# Patient Record
Sex: Female | Born: 1940 | Race: White | Hispanic: No | Smoking: Former smoker
Health system: Southern US, Community
[De-identification: ages and names within clinical notes are randomized; demographics above are authoritative.]

## PROBLEM LIST (undated history)

## (undated) DIAGNOSIS — H109 Unspecified conjunctivitis: Secondary | ICD-10-CM

## (undated) DIAGNOSIS — M1990 Unspecified osteoarthritis, unspecified site: Secondary | ICD-10-CM

## (undated) DIAGNOSIS — F329 Major depressive disorder, single episode, unspecified: Secondary | ICD-10-CM

## (undated) DIAGNOSIS — R51 Headache: Secondary | ICD-10-CM

## (undated) DIAGNOSIS — F32A Depression, unspecified: Secondary | ICD-10-CM

## (undated) DIAGNOSIS — G4730 Sleep apnea, unspecified: Secondary | ICD-10-CM

## (undated) DIAGNOSIS — E7800 Pure hypercholesterolemia, unspecified: Secondary | ICD-10-CM

## (undated) DIAGNOSIS — G20 Parkinson's disease: Secondary | ICD-10-CM

## (undated) DIAGNOSIS — F0280 Dementia in other diseases classified elsewhere without behavioral disturbance: Secondary | ICD-10-CM

## (undated) DIAGNOSIS — I10 Essential (primary) hypertension: Secondary | ICD-10-CM

## (undated) DIAGNOSIS — K219 Gastro-esophageal reflux disease without esophagitis: Secondary | ICD-10-CM

## (undated) DIAGNOSIS — R413 Other amnesia: Secondary | ICD-10-CM

## (undated) DIAGNOSIS — M797 Fibromyalgia: Secondary | ICD-10-CM

## (undated) HISTORY — DX: Other amnesia: R41.3

## (undated) HISTORY — PX: APPENDECTOMY: SHX54

## (undated) HISTORY — PX: TONSILLECTOMY: SUR1361

## (undated) HISTORY — DX: Parkinson's disease: G20

## (undated) HISTORY — DX: Pure hypercholesterolemia, unspecified: E78.00

## (undated) HISTORY — PX: CARPAL TUNNEL RELEASE: SHX101

---
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
## 2013-09-22 ENCOUNTER — Other Ambulatory Visit: Payer: Self-pay | Admitting: Orthopedic Surgery

## 2013-09-29 NOTE — Patient Instructions (Addendum)
20     Your procedure is scheduled on:  Wednesday 10/11/2013  Report to [facility] Main Entrance and follow signs to Short Stay  At 0730  AM.  Call this number if you have problems the night before or morning of surgery:  [phone]   Remember:             IF YOU USE CPAP,BRING MASK AND TUBING AM OF SURGERY!             IF YOU DO NOT HAVE YOUR TYPE AND SCREEN DRAWN AT PRE-ADMIT APPOINTMENT, YOU WILL HAVE IT DRAWN AM OF SURGERY!   Do not eat food or drink liquids AFTER MIDNIGHT!  Take these medicines the morning of surgery with A SIP OF WATER: Metoprolol, Gabapentin,  Fetzima, Fiorcet, Tramadol if needed    [facility] IS NOT RESPONSIBLE FOR ANY BELONGINGS OR VALUABLES BROUGHT TO HOSPITAL.  Marland Kitchen.  Leave suitcase in the car. After surgery it may be brought to your room.  For patients admitted to the hospital, checkout time is 11:00 AM the day of              Discharge.    DO NOT WEAR  JEWELRY,MAKE-UP,LOTIONS,POWDERS,PERFUMES,CONTACTS , DENTURES OR BRIDGEWORK ,AND DO NOT WEAR FALSE EYELASHES                                    Patients discharged the day of surgery will not be allowed to drive home.  If going home the same day of surgery, must have someone stay with you  first 24 hrs.at home and arrange for someone to drive you home from the              Hospital.              YOUR DRIVER IS: N/   Special Instructions:              Please read over the following fact sheets that you were given:             1. [facility] PREPARING FOR SURGERY SHEET              2.[facility] SP[facility]OMETRY                                        [place] M.Cathlean Saueranney,RN,BSN     [phone]                              [facility] - Preparing for Surgery Before surgery, you can play an important role.  Because skin is not sterile, your skin needs to be as free of germs as possible.  You can reduce the number of germs on your skin by washing with CHG (chlorahexidine gluconate) soap before surgery.  CHG is an  antiseptic cleaner which kills germs and bonds with the skin to continue killing germs even after washing. Please DO NOT use if you have an allergy to CHG or antibacterial soaps.  If your skin becomes reddened/irritated stop using the CHG and inform your nurse when you arrive at Short Stay. Do not shave (including legs and underarms) for at least 48 hours prior to the first CHG shower.  You may shave your face/neck. Please follow these instructions  carefully:  1.  Shower with CHG Soap the night before surgery and the  morning of Surgery.  2.  If you choose to wash your hair, wash your hair first as usual with your  normal  shampoo.  3.  After you shampoo, rinse your hair and body thoroughly to remove the  shampoo.                           4.  Use CHG as you would any other liquid soap.  You can apply chg directly  to the skin and wash                       Gently with a scrungie or clean washcloth.  5.  Apply the CHG Soap to your body ONLY FROM THE NECK DOWN.   Do not use on face/ open                           Wound or open sores. Avoid contact with eyes, ears mouth and genitals (private parts).                       Wash face,  Genitals (private parts) with your normal soap.             6.  Wash thoroughly, paying special attention to the area where your surgery  will be performed.  7.  Thoroughly rinse your body with warm water from the neck down.  8.  DO NOT shower/wash with your normal soap after using and rinsing off  the CHG Soap.                9.  Pat yourself dry with a clean towel.            10.  Wear clean pajamas.            11.  Place clean sheets on your bed the night of your first shower and do not  sleep with pets. Day of Surgery : Do not apply any lotions/deodorants the morning of surgery.  Please wear clean clothes to the hospital/surgery center.  FAILURE TO FOLLOW THESE INSTRUCTIONS MAY RESULT IN THE CANCELLATION OF YOUR SURGERY PATIENT  SIGNATURE_________________________________  NURSE SIGNATURE__________________________________  ________________________________________________________________________   Amber [name]  An incentive spirometer is a tool that can help keep your lungs clear and active. This tool measures how well you are filling your lungs with each breath. Taking long deep breaths may help reverse or decrease the chance of developing breathing (pulmonary) problems (especially infection) following:  A long period of time when you are unable to move or be active. BEFORE THE PROCEDURE   If the spirometer includes an indicator to show your best effort, your nurse or respiratory therapist will set it to a desired goal.  If possible, sit up straight or lean slightly forward. Try not to slouch.  Hold the incentive spirometer in an upright position. INSTRUCTIONS FOR USE  1. Sit on the edge of your bed if possible, or sit up as far as you can in bed or on a chair. 2. Hold the incentive spirometer in an upright position. 3. Breathe out normally. 4. Place the mouthpiece in your mouth and seal your lips tightly around it. 5. Breathe in slowly and as deeply as possible, raising the piston or the ball toward the top of  the column. 6. Hold your breath for 3-5 seconds or for as long as possible. Allow the piston or ball to fall to the bottom of the column. 7. Remove the mouthpiece from your mouth and breathe out normally. 8. Rest for a few seconds and repeat Steps 1 through 7 at least 10 times every 1-2 hours when you are awake. Take your time and take a few normal breaths between deep breaths. 9. The spirometer may include an indicator to show your best effort. Use the indicator as a goal to work toward during each repetition. 10. After each set of 10 deep breaths, practice coughing to be sure your lungs are clear. If you have an incision (the cut made at the time of surgery), support your incision when coughing  by placing a pillow or rolled up towels firmly against it. Once you are able to get out of bed, walk around indoors and cough well. You may stop using the incentive spirometer when instructed by your caregiver.  RISKS AND COMPLICATIONS  Take your time so you do not get dizzy or light-headed.  If you are in pain, you may need to take or ask for pain medication before doing incentive spirometry. It is harder to take a deep breath if you are having pain. AFTER USE  Rest and breathe slowly and easily.  It can be helpful to keep track of a log of your progress. Your caregiver can provide you with a simple table to help with this. If you are using the spirometer at home, follow these instructions: SEEK MEDICAL CARE IF:   You are having difficultly using the spirometer.  You have trouble using the spirometer as often as instructed.  Your pain medication is not giving enough relief while using the spirometer.  You develop fever of 100.5 F (38.1 C) or higher. SEEK IMMEDIATE MEDICAL CARE IF:   You cough up bloody sputum that had not been present before.  You develop fever of 102 F (38.9 C) or greater.  You develop worsening pain at or near the incision site. MAKE SURE YOU:   Understand these instructions.  Will watch your condition.  Will get help right away if you are not doing well or get worse. Document Released: 08/17/2006 Document Revised: 06/29/2011 Document Reviewed: 10/18/2006 ExitCare Patient Information 2014 ExitCare, [place].   ________________________________________________________________________  WHAT IS A BLOOD TRANSFUSION? Blood Transfusion Information  A transfusion is the replacement of blood or some of its parts. Blood is made up of multiple cells which provide different functions.  Red blood cells carry oxygen and are used for blood loss replacement.  White blood cells fight against infection.  Platelets control bleeding.  Plasma helps clot  blood.  Other blood products are available for specialized needs, such as hemophilia or other clotting disorders. BEFORE THE TRANSFUSION  Who gives blood for transfusions?   Healthy volunteers who are fully evaluated to make sure their blood is safe. This is blood bank blood. Transfusion therapy is the safest it has ever been in the practice of medicine. Before blood is taken from a donor, a complete history is taken to make sure that person has no history of diseases nor engages in risky social behavior (examples are intravenous drug use or sexual activity with multiple partners). The donor's travel history is screened to minimize risk of transmitting infections, such as malaria. The donated blood is tested for signs of infectious diseases, such as HIV and hepatitis. The blood is then tested to be  sure it is compatible with you in order to minimize the chance of a transfusion reaction. If you or a relative donates blood, this is often done in anticipation of surgery and is not appropriate for emergency situations. It takes many days to process the donated blood. RISKS AND COMPLICATIONS Although transfusion therapy is very safe and saves many lives, the main dangers of transfusion include:   Getting an infectious disease.  Developing a transfusion reaction. This is an allergic reaction to something in the blood you were given. Every precaution is taken to prevent this. The decision to have a blood transfusion has been considered carefully by your caregiver before blood is given. Blood is not given unless the benefits outweigh the risks. AFTER THE TRANSFUSION  Right after receiving a blood transfusion, you will usually feel much better and more energetic. This is especially true if your red blood cells have gotten low (anemic). The transfusion raises the level of the red blood cells which carry oxygen, and this usually causes an energy increase.  The nurse administering the transfusion will monitor  you carefully for complications. HOME CARE INSTRUCTIONS  No special instructions are needed after a transfusion. You may find your energy is better. Speak with your caregiver about any limitations on activity for underlying diseases you may have. SEEK MEDICAL CARE IF:   Your condition is not improving after your transfusion.  You develop redness or irritation at the intravenous (IV) site. SEEK IMMEDIATE MEDICAL CARE IF:  Any of the following symptoms occur over the next 12 hours:  Shaking chills.  You have a temperature by mouth above 102 F (38.9 C), not controlled by medicine.  Chest, back, or muscle pain.  People around you feel you are not acting correctly or are confused.  Shortness of breath or difficulty breathing.  Dizziness and fainting.  You get a rash or develop hives.  You have a decrease in urine output.  Your urine turns a dark color or changes to pink, red, or brown. Any of the following symptoms occur over the next 10 days:  You have a temperature by mouth above 102 F (38.9 C), not controlled by medicine.  Shortness of breath.  Weakness after normal activity.  The white part of the eye turns yellow (jaundice).  You have a decrease in the amount of urine or are urinating less often.  Your urine turns a dark color or changes to pink, red, or brown. Document Released: 04/03/2000 Document Revised: 06/29/2011 Document Reviewed: 11/21/2007 [facility] Patient Information 2014 [place], [place].  _______________________________________________________________________

## 2013-10-02 ENCOUNTER — Encounter (HOSPITAL_COMMUNITY): Payer: Self-pay | Admitting: Pharmacy Technician

## 2013-10-03 ENCOUNTER — Encounter (HOSPITAL_COMMUNITY)
Admission: RE | Admit: 2013-10-03 | Discharge: 2013-10-03 | Disposition: A | Discharge status: Home / Self Care | Payer: Medicare Other | Source: Ambulatory Visit | Attending: Orthopedic Surgery | Admitting: Orthopedic Surgery

## 2013-10-03 ENCOUNTER — Encounter (INDEPENDENT_AMBULATORY_CARE_PROVIDER_SITE_OTHER): Payer: Self-pay

## 2013-10-03 ENCOUNTER — Ambulatory Visit (HOSPITAL_COMMUNITY)
Admission: RE | Admit: 2013-10-03 | Discharge: 2013-10-03 | Disposition: A | Discharge status: Home / Self Care | Payer: Medicare Other | Source: Ambulatory Visit | Attending: Orthopedic Surgery | Admitting: Orthopedic Surgery

## 2013-10-03 ENCOUNTER — Encounter (HOSPITAL_COMMUNITY): Payer: Self-pay

## 2013-10-03 DIAGNOSIS — M4120 Other idiopathic scoliosis, site unspecified: Secondary | ICD-10-CM | POA: Insufficient documentation

## 2013-10-03 DIAGNOSIS — M25559 Pain in unspecified hip: Secondary | ICD-10-CM | POA: Insufficient documentation

## 2013-10-03 HISTORY — DX: Fibromyalgia: M79.7

## 2013-10-03 HISTORY — DX: Major depressive disorder, single episode, unspecified: F32.9

## 2013-10-03 HISTORY — DX: Sleep apnea, unspecified: G47.30

## 2013-10-03 HISTORY — DX: Gastro-esophageal reflux disease without esophagitis: K21.9

## 2013-10-03 HISTORY — DX: Depression, unspecified: F32.A

## 2013-10-03 HISTORY — DX: Essential (primary) hypertension: I10

## 2013-10-03 HISTORY — DX: Headache: R51

## 2013-10-03 HISTORY — DX: Unspecified osteoarthritis, unspecified site: M19.90

## 2013-10-03 LAB — ABO/RH: ABO/RH(D): B POS

## 2013-10-03 LAB — COMPREHENSIVE METABOLIC PANEL
ALBUMIN: 3.8 g/dL (ref 3.5–5.2)
ALT: 17 U/L (ref 0–35)
AST: 23 U/L (ref 0–37)
Alkaline Phosphatase: 80 U/L (ref 39–117)
BUN: 19 mg/dL (ref 6–23)
CO2: 27 mEq/L (ref 19–32)
Calcium: 9.4 mg/dL (ref 8.4–10.5)
Chloride: 103 mEq/L (ref 96–112)
Creatinine, Ser: 0.69 mg/dL (ref 0.50–1.10)
GFR calc Af Amer: >90 mL/min (ref >90)
GFR calc non Af Amer: 84 mL/min — ABNORMAL LOW (ref >90)
Glucose, Bld: 93 mg/dL (ref 70–99)
Potassium: 4.8 mEq/L (ref 3.7–5.3)
Sodium: 141 mEq/L (ref 137–147)
Total Protein: 6.6 g/dL (ref 6.0–8.3)

## 2013-10-03 LAB — URINALYSIS, ROUTINE W REFLEX MICROSCOPIC
BILIRUBIN URINE: NEGATIVE
GLUCOSE, UA: NEGATIVE mg/dL
Hgb urine dipstick: NEGATIVE
KETONES UR: NEGATIVE mg/dL
LEUKOCYTES UA: NEGATIVE
Nitrite: NEGATIVE
Protein, ur: NEGATIVE mg/dL
SPECIFIC GRAVITY, URINE: 1.019 (ref 1.005–1.030)
Urobilinogen, UA: 0.2 mg/dL (ref 0.0–1.0)
pH: 6 (ref 5.0–8.0)

## 2013-10-03 LAB — SURGICAL PCR SCREEN
MRSA, PCR: NEGATIVE
STAPHYLOCOCCUS AUREUS: POSITIVE — AB

## 2013-10-03 LAB — CBC
HCT: 38.9 % (ref 36.0–46.0)
Hemoglobin: 12.7 g/dL (ref 12.0–15.0)
MCH: 32 pg (ref 26.0–34.0)
MCHC: 32.6 g/dL (ref 30.0–36.0)
MCV: 98 fL (ref 78.0–100.0)
PLATELETS: 269 10*3/uL (ref 150–400)
RBC: 3.97 MIL/uL (ref 3.87–5.11)
RDW: 13.1 % (ref 11.5–15.5)
WBC: 5.6 10*3/uL (ref 4.0–10.5)

## 2013-10-03 LAB — PROTIME-INR
INR: 0.94 (ref 0.00–1.49)
Prothrombin Time: 12.4 seconds (ref 11.6–15.2)

## 2013-10-03 LAB — APTT: APTT: 27 s (ref 24–37)

## 2013-10-03 NOTE — Progress Notes (Addendum)
[facility]. 08/29/2013-Office visit from Dr. Romeo RabonMichael Caplan [facility] [place], [place]on chart. 09/18/2013-Office visit from Dr. Teofilo PodZagol 01/26/2013-2-D echo and Stress test from [facility] on chart. 08/15/2013-pre-operative clearance on chart from Dr. Fonnie JarvisM. Caplan on chart.

## 2013-10-10 ENCOUNTER — Other Ambulatory Visit: Payer: Self-pay | Admitting: Orthopedic Surgery

## 2013-10-10 NOTE — H&P (Signed)
Amber RightPatricia P. [name] DOB: 05/29/1940 Widowed / Language: [place] / Race: White Female Date of Admission:  10-11-2013  Chief Complaint:  [name]  History of Present Illness The patient is a 73 year old female who comes in for a preoperative History and Physical. The patient is scheduled for a [name] total hip arthroplasty (anterior approach) to be performed by Dr. Gus RankinFrank V. Aluisio, MD at [facility] on 10-11-2013. The patient is a 73 year old female who presents with a hip problem. The patient was seen for a second opinion.The patient reports [name] hip problems including pain symptoms that have been present for year(s). The symptoms began without any known injury. Prior to being seen, the patient was previously evaluated by a colleague. Previous workup for this problem has included pelvic x-rays and hip x-rays (which she had sent to our office/xray film). Previous treatment for this problem has included oral corticosteroids, corticosteroid injection and opioid analgesics. Note for "Hip problem": She said her husband was paralyzed, after a car accident, and she had to care for him for many years. The hip had been hurting at all times. It was in the groin and buttock, radiating down her thigh. It was limiting what she can and cannot do. She says the pain was extremely intense for several months and then she had a recent intraarticular cortisone injection which has helped temporarily, and partially. Her range of motion has been decreased over time. She is not having any back pain, lower extremity weakness or paraesthesias with this. The hip hurts day and night. It is felt that she would benefit from a hip replacement and she has elected to proceed with surgery. They have been treated conservatively in the past for the above stated problem and despite conservative measures, they continue to have progressive pain and severe functional limitations and dysfunction. They have failed  non-operative management including home exercise, medications, and injections. It is felt that they would benefit from undergoing total joint replacement. Risks and benefits of the procedure have been discussed with the patient and they elect to proceed with surgery. There are no active contraindications to surgery such as ongoing infection or rapidly progressive neurological disease.  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 Father.   Social History Marital status. widowed Never consumed alcohol. 06/22/2013: Never consumed alcohol No history of drug/alcohol rehab Current work status. retired Exercise. Exercises never Living situation. live alone Tobacco use. Former smoker. 06/22/2013: smoke(d) 1 pack(s) per day Not under pain contract Number of flights of stairs before winded. 1 Tobacco / smoke exposure. 06/22/2013: no Children. 1 Post-Surgical Plans. [facility] [place], [place] Advance Directives. Living Will, Healthcare POA    Medication History Armour Thyroid (15MG  Tablet, Oral) Active. Omeprazole (40MG  Capsule DR, Oral) Active. Daypro (600MG  Tablet, Oral) Active. Ranitidine HCl (300MG  Tablet, Oral) Active. Fetzima (40MG  Capsule ER 24HR, Oral) Active. Acyclovir (800MG  Tablet, Oral) Active. Metoprolol Succinate ER (100MG  Tablet ER 24HR, Oral) Active. Vitamin A ( Oral) Specific dose unknown - Active. Vitamin B-2 ( Oral) Specific dose unknown - Active. Vitamin B-12 ( Oral) Specific dose unknown - Active. Vitamin D3 ( Oral) Specific dose unknown - Active. Potassium Iodide (Antidote) ( Oral) Specific dose unknown - Active. MAX OX 400mg  Active. Flaxseed Oil ( Oral)  Specific dose unknown - Active.  ALLERDOPHILUS Active. Atorvastatin Calcium (40MG  Tablet, Oral) Active. Norco (5-325MG  Tablet, Oral) Active. (PRN) Tramadol-Acetaminophen (37.5-325MG  Tablet, Oral) Active. (PRN) Fioricet ( Oral) Specific dose unknown - Active. (PRN) Colace (100MG  Capsule, Oral) Active. (PRN)    Past Surgical History Tonsillectomy Appendectomy Carpal Tunnel Repair. bilateral   Review of Systems General:Present- Fatigue. Not Present- Chills, Fever, Night Sweats, Weight Gain, Weight Loss and Memory Loss. Skin:Not Present- Hives, Itching, Rash, Eczema and Lesions. HEENT:Present- Headache. Not Present- Tinnitus, Double Vision, Visual Loss, Hearing Loss and Dentures. Respiratory:Not Present- Shortness of breath with exertion, Shortness of breath at rest, Allergies, Coughing up blood and Chronic Cough. Cardiovascular:Not Present- Chest Pain, Racing/skipping heartbeats, Difficulty Breathing Lying Down, Murmur, Swelling and Palpitations. Gastrointestinal:Present- Constipation. Not Present- Bloody Stool, Heartburn, Abdominal Pain, Vomiting, Nausea, Diarrhea, Difficulty Swallowing, Jaundice and Loss of appetitie. Female Genitourinary:Present- Urinary frequency and Urinating at Night. Not Present- Blood in Urine, Weak urinary stream, Discharge, Flank Pain, Incontinence, Painful Urination, Urgency and Urinary Retention. Musculoskeletal:Present- Joint Pain and Morning Stiffness. Not Present- Muscle Weakness, Muscle Pain, Joint Swelling, Back Pain and Spasms. Neurological:Not Present- Tremor, Dizziness, Blackout spells, Paralysis, Difficulty with balance and Weakness. Psychiatric:Not Present- Insomnia.    Vitals Weight: 118 lb Height: 61 in Weight was reported by patient. Height was reported by patient. Body Surface Area: 1.52 m Body Mass Index: 22.3 kg/m BP: 138/78 (Sitting, [name] Arm, Standard)    Physical Exam The physical exam findings are as  follows:  Patient is a 73 year old female with continued hip pain. She is accompanied by her daughter, Amber [name].   General Mental Status - Alert, cooperative and good historian. General Appearance- pleasant. Not in acute distress. Orientation- Oriented X3. Build & Nutrition- Well nourished and Well developed.   Head and Neck Head- normocephalic, atraumatic . Neck Global Assessment- supple. no bruit auscultated on the [name] and no bruit auscultated on the left.   Eye Vision- Wears corrective lenses. Pupil- Bilateral- Regular and Round. Motion- Bilateral- EOMI.   Chest and Lung Exam Auscultation: Breath sounds:- clear at anterior chest wall and - clear at posterior chest wall. Adventitious sounds:- No Adventitious sounds.   Cardiovascular Auscultation:Rhythm- Regular rate and rhythm. Heart Sounds- S1 WNL and S2 WNL. Murmurs & Other Heart Sounds:Auscultation of the heart reveals - No Murmurs.   Abdomen Palpation/Percussion:Tenderness- Abdomen is non-tender to palpation. Rigidity (guarding)- Abdomen is soft. Auscultation:Auscultation of the abdomen reveals - Bowel sounds normal.   Female Genitourinary Not done, not pertinent to present illness  Musculoskeletal Musculoskeletal: Evaluation of her left hip shows normal range of motion. no discomfort. [name] hip flexion about 90 minimal internal rotation, about 20 external rotation, 30 abduction.  X-RAYS: AP pelvis and lateral of the hip that she brought with her today showed bone on bone arthritis in the [name] hip with subchondral cyst formation. Left hip shows minimal narrowing.   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 Lequita HaltAluisio.  Plan is to go to SNF. She wants to look into [facility] [place], [place] Contact Admissions Coordinator:  Josiah LoboSarah  Blair Phone [phone] Fax [phone]  PCP - Dr. Oscar Laaplan - Patient has been seen preoperatively and felt to be stable for surgery.  The patient does not have any contraindications and will receive TXA (tranexamic acid) prior to surgery.  Signed electronically by Lauraine RinneAlexzandrew L Perkins, III PA-C

## 2013-10-11 ENCOUNTER — Inpatient Hospital Stay (HOSPITAL_COMMUNITY): Payer: Medicare Other

## 2013-10-11 ENCOUNTER — Encounter (HOSPITAL_COMMUNITY): Payer: Self-pay | Admitting: *Deleted

## 2013-10-11 ENCOUNTER — Inpatient Hospital Stay (HOSPITAL_COMMUNITY)
Admission: RE | Admit: 2013-10-11 | Discharge: 2013-10-14 | DRG: 470 | Disposition: A | Discharge status: Skilled Nursing Facility | Payer: Medicare Other | Source: Ambulatory Visit | Attending: Orthopedic Surgery | Admitting: Orthopedic Surgery

## 2013-10-11 ENCOUNTER — Encounter (HOSPITAL_COMMUNITY): Payer: Medicare Other | Admitting: Registered Nurse

## 2013-10-11 ENCOUNTER — Encounter (HOSPITAL_COMMUNITY)
Admission: RE | Disposition: A | Discharge status: Skilled Nursing Facility | Payer: Self-pay | Source: Ambulatory Visit | Attending: Orthopedic Surgery

## 2013-10-11 ENCOUNTER — Inpatient Hospital Stay (HOSPITAL_COMMUNITY): Payer: Medicare Other | Admitting: Registered Nurse

## 2013-10-11 DIAGNOSIS — M1610 Unilateral primary osteoarthritis, unspecified hip: Principal | ICD-10-CM | POA: Diagnosis present

## 2013-10-11 DIAGNOSIS — G4730 Sleep apnea, unspecified: Secondary | ICD-10-CM | POA: Diagnosis present

## 2013-10-11 DIAGNOSIS — M1611 Unilateral primary osteoarthritis, right hip: Secondary | ICD-10-CM

## 2013-10-11 DIAGNOSIS — I10 Essential (primary) hypertension: Secondary | ICD-10-CM | POA: Diagnosis present

## 2013-10-11 DIAGNOSIS — M810 Age-related osteoporosis without current pathological fracture: Secondary | ICD-10-CM | POA: Diagnosis present

## 2013-10-11 DIAGNOSIS — Z96641 Presence of right artificial hip joint: Secondary | ICD-10-CM

## 2013-10-11 DIAGNOSIS — F329 Major depressive disorder, single episode, unspecified: Secondary | ICD-10-CM | POA: Diagnosis present

## 2013-10-11 DIAGNOSIS — H409 Unspecified glaucoma: Secondary | ICD-10-CM | POA: Diagnosis present

## 2013-10-11 DIAGNOSIS — M169 Osteoarthritis of hip, unspecified: Principal | ICD-10-CM | POA: Diagnosis present

## 2013-10-11 DIAGNOSIS — F3289 Other specified depressive episodes: Secondary | ICD-10-CM | POA: Diagnosis present

## 2013-10-11 DIAGNOSIS — M76899 Other specified enthesopathies of unspecified lower limb, excluding foot: Secondary | ICD-10-CM | POA: Diagnosis present

## 2013-10-11 DIAGNOSIS — E7800 Pure hypercholesterolemia, unspecified: Secondary | ICD-10-CM | POA: Diagnosis present

## 2013-10-11 DIAGNOSIS — G8929 Other chronic pain: Secondary | ICD-10-CM | POA: Diagnosis present

## 2013-10-11 DIAGNOSIS — K219 Gastro-esophageal reflux disease without esophagitis: Secondary | ICD-10-CM | POA: Diagnosis present

## 2013-10-11 DIAGNOSIS — Z87891 Personal history of nicotine dependence: Secondary | ICD-10-CM

## 2013-10-11 DIAGNOSIS — IMO0001 Reserved for inherently not codable concepts without codable children: Secondary | ICD-10-CM | POA: Diagnosis present

## 2013-10-11 DIAGNOSIS — Z79899 Other long term (current) drug therapy: Secondary | ICD-10-CM

## 2013-10-11 HISTORY — PX: TOTAL HIP ARTHROPLASTY: SHX124

## 2013-10-11 LAB — TYPE AND SCREEN
ABO/RH(D): B POS
Antibody Screen: NEGATIVE

## 2013-10-11 SURGERY — ARTHROPLASTY, HIP, TOTAL, ANTERIOR APPROACH
Anesthesia: Spinal | Site: Hip | Laterality: Right

## 2013-10-11 MED ORDER — ONDANSETRON HCL 4 MG/2ML IJ SOLN
INTRAMUSCULAR | Status: DC | PRN
  Administered 2013-10-11: 4 mg via INTRAVENOUS

## 2013-10-11 MED ORDER — METHOCARBAMOL 500 MG PO TABS
500.0000 mg | ORAL_TABLET | Freq: Four times a day (QID) | ORAL | Status: DC | PRN
  Administered 2013-10-12 – 2013-10-13 (×3): 500 mg via ORAL
  Filled 2013-10-11 (×3): qty 1

## 2013-10-11 MED ORDER — HYDROMORPHONE HCL PF 1 MG/ML IJ SOLN: 0.2500 mg | INTRAMUSCULAR | Status: DC | PRN

## 2013-10-11 MED ORDER — DOCUSATE SODIUM 100 MG PO CAPS
100.0000 mg | ORAL_CAPSULE | Freq: Two times a day (BID) | ORAL | Status: DC
  Administered 2013-10-11 – 2013-10-14 (×6): 100 mg via ORAL
  Filled 2013-10-11 (×7): qty 1

## 2013-10-11 MED ORDER — METOCLOPRAMIDE HCL 5 MG/ML IJ SOLN: 5.0000 mg | Freq: Three times a day (TID) | INTRAMUSCULAR | Status: DC | PRN

## 2013-10-11 MED ORDER — ACETAMINOPHEN 325 MG PO TABS
650.0000 mg | ORAL_TABLET | Freq: Four times a day (QID) | ORAL | Status: DC | PRN
  Administered 2013-10-12 – 2013-10-13 (×3): 650 mg via ORAL
  Filled 2013-10-11 (×5): qty 2

## 2013-10-11 MED ORDER — BISACODYL 10 MG RE SUPP: 10.0000 mg | Freq: Every day | RECTAL | Status: DC | PRN

## 2013-10-11 MED ORDER — GABAPENTIN 300 MG PO CAPS
300.0000 mg | ORAL_CAPSULE | Freq: Every day | ORAL | Status: DC
  Administered 2013-10-11 – 2013-10-13 (×3): 300 mg via ORAL
  Filled 2013-10-11 (×4): qty 1

## 2013-10-11 MED ORDER — POLYETHYLENE GLYCOL 3350 17 G PO PACK: 17.0000 g | PACK | Freq: Every day | ORAL | Status: DC | PRN

## 2013-10-11 MED ORDER — RIVAROXABAN 10 MG PO TABS
10.0000 mg | ORAL_TABLET | Freq: Every day | ORAL | Status: DC
  Administered 2013-10-12 – 2013-10-14 (×3): 10 mg via ORAL
  Filled 2013-10-11 (×4): qty 1

## 2013-10-11 MED ORDER — BUPIVACAINE HCL (PF) 0.25 % IJ SOLN
INTRAMUSCULAR | Status: AC
  Filled 2013-10-11: qty 30

## 2013-10-11 MED ORDER — PHENOL 1.4 % MT LIQD
1.0000 | OROMUCOSAL | Status: DC | PRN
Start: 2013-10-11 — End: 2013-10-14

## 2013-10-11 MED ORDER — 0.9 % SODIUM CHLORIDE (POUR BTL) OPTIME
TOPICAL | Status: DC | PRN
  Administered 2013-10-11: 1000 mL

## 2013-10-11 MED ORDER — ATORVASTATIN CALCIUM 40 MG PO TABS
40.0000 mg | ORAL_TABLET | Freq: Every day | ORAL | Status: DC
  Administered 2013-10-11 – 2013-10-14 (×4): 40 mg via ORAL
  Filled 2013-10-11 (×4): qty 1

## 2013-10-11 MED ORDER — CEFAZOLIN SODIUM 1-5 GM-% IV SOLN
1.0000 g | Freq: Four times a day (QID) | INTRAVENOUS | Status: AC
  Administered 2013-10-11 (×2): 1 g via INTRAVENOUS
  Filled 2013-10-11 (×2): qty 50

## 2013-10-11 MED ORDER — MIDAZOLAM HCL 2 MG/2ML IJ SOLN
INTRAMUSCULAR | Status: AC
  Filled 2013-10-11: qty 2

## 2013-10-11 MED ORDER — OXYCODONE HCL 5 MG PO TABS
5.0000 mg | ORAL_TABLET | ORAL | Status: DC | PRN
  Administered 2013-10-11 – 2013-10-12 (×4): 10 mg via ORAL
  Filled 2013-10-11 (×4): qty 2

## 2013-10-11 MED ORDER — ACETAMINOPHEN 650 MG RE SUPP: 650.0000 mg | Freq: Four times a day (QID) | RECTAL | Status: DC | PRN

## 2013-10-11 MED ORDER — METOPROLOL SUCCINATE ER 50 MG PO TB24: 50.0000 mg | ORAL_TABLET | Freq: Every morning | ORAL | Status: DC

## 2013-10-11 MED ORDER — SODIUM CHLORIDE 0.9 % IJ SOLN
INTRAMUSCULAR | Status: AC
  Filled 2013-10-11: qty 50

## 2013-10-11 MED ORDER — DEXAMETHASONE SODIUM PHOSPHATE 10 MG/ML IJ SOLN
INTRAMUSCULAR | Status: AC
  Filled 2013-10-11: qty 1

## 2013-10-11 MED ORDER — ZOLPIDEM TARTRATE 5 MG PO TABS
5.0000 mg | ORAL_TABLET | Freq: Every day | ORAL | Status: DC
  Administered 2013-10-11 – 2013-10-13 (×3): 5 mg via ORAL
  Filled 2013-10-11 (×3): qty 1

## 2013-10-11 MED ORDER — SODIUM CHLORIDE 0.9 % IJ SOLN
INTRAMUSCULAR | Status: DC | PRN
  Administered 2013-10-11: 30 mL via INTRAVENOUS

## 2013-10-11 MED ORDER — FLEET ENEMA 7-19 GM/118ML RE ENEM: 1.0000 | ENEMA | Freq: Once | RECTAL | Status: AC | PRN

## 2013-10-11 MED ORDER — ACETAMINOPHEN 500 MG PO TABS
1000.0000 mg | ORAL_TABLET | Freq: Four times a day (QID) | ORAL | Status: AC
  Administered 2013-10-11 (×2): 1000 mg via ORAL
  Administered 2013-10-12: 975 mg via ORAL
  Administered 2013-10-12: 1000 mg via ORAL
  Filled 2013-10-11 (×3): qty 2

## 2013-10-11 MED ORDER — CEFAZOLIN SODIUM-DEXTROSE 2-3 GM-% IV SOLR
INTRAVENOUS | Status: AC
  Filled 2013-10-11: qty 50

## 2013-10-11 MED ORDER — CHLORHEXIDINE GLUCONATE 0.12 % MT SOLN
15.0000 mL | Freq: Two times a day (BID) | OROMUCOSAL | Status: DC
  Administered 2013-10-11 – 2013-10-14 (×6): 15 mL via OROMUCOSAL
  Filled 2013-10-11 (×7): qty 15

## 2013-10-11 MED ORDER — TRANEXAMIC ACID 100 MG/ML IV SOLN
1000.0000 mg | INTRAVENOUS | Status: AC
  Administered 2013-10-11: 1000 mg via INTRAVENOUS
  Filled 2013-10-11: qty 10

## 2013-10-11 MED ORDER — MORPHINE SULFATE 2 MG/ML IJ SOLN
1.0000 mg | INTRAMUSCULAR | Status: DC | PRN
  Administered 2013-10-11: 1 mg via INTRAVENOUS
  Filled 2013-10-11: qty 1

## 2013-10-11 MED ORDER — LACTATED RINGERS IV SOLN
INTRAVENOUS | Status: DC
Start: 2013-10-11 — End: 2013-10-11
  Administered 2013-10-11: 11:00:00 via INTRAVENOUS
  Administered 2013-10-11: 1000 mL via INTRAVENOUS

## 2013-10-11 MED ORDER — FAMOTIDINE 20 MG PO TABS
20.0000 mg | ORAL_TABLET | Freq: Two times a day (BID) | ORAL | Status: DC
  Administered 2013-10-11 – 2013-10-14 (×6): 20 mg via ORAL
  Filled 2013-10-11 (×8): qty 1

## 2013-10-11 MED ORDER — METHOCARBAMOL 1000 MG/10ML IJ SOLN
500.0000 mg | Freq: Four times a day (QID) | INTRAVENOUS | Status: DC | PRN
  Administered 2013-10-11: 500 mg via INTRAVENOUS
  Filled 2013-10-11: qty 5

## 2013-10-11 MED ORDER — MENTHOL 3 MG MT LOZG: 1.0000 | LOZENGE | OROMUCOSAL | Status: DC | PRN

## 2013-10-11 MED ORDER — FENTANYL CITRATE 0.05 MG/ML IJ SOLN
INTRAMUSCULAR | Status: AC
  Filled 2013-10-11: qty 5

## 2013-10-11 MED ORDER — PANTOPRAZOLE SODIUM 40 MG PO TBEC: 80.0000 mg | DELAYED_RELEASE_TABLET | Freq: Every day | ORAL | Status: DC

## 2013-10-11 MED ORDER — PROPOFOL 10 MG/ML IV BOLUS
INTRAVENOUS | Status: AC
  Filled 2013-10-11: qty 20

## 2013-10-11 MED ORDER — LEVOMILNACIPRAN HCL ER 40 MG PO CP24
40.0000 mg | ORAL_CAPSULE | Freq: Every morning | ORAL | Status: DC
  Administered 2013-10-14: 40 mg via ORAL

## 2013-10-11 MED ORDER — METOCLOPRAMIDE HCL 10 MG PO TABS: 5.0000 mg | ORAL_TABLET | Freq: Three times a day (TID) | ORAL | Status: DC | PRN

## 2013-10-11 MED ORDER — ONDANSETRON HCL 4 MG PO TABS: 4.0000 mg | ORAL_TABLET | Freq: Four times a day (QID) | ORAL | Status: DC | PRN

## 2013-10-11 MED ORDER — DEXAMETHASONE 6 MG PO TABS
10.0000 mg | ORAL_TABLET | Freq: Every day | ORAL | Status: AC
  Administered 2013-10-12: 10 mg via ORAL
  Filled 2013-10-11: qty 1

## 2013-10-11 MED ORDER — ONDANSETRON HCL 4 MG/2ML IJ SOLN
INTRAMUSCULAR | Status: AC
  Filled 2013-10-11: qty 2

## 2013-10-11 MED ORDER — CEFAZOLIN SODIUM-DEXTROSE 2-3 GM-% IV SOLR
2.0000 g | INTRAVENOUS | Status: AC
  Administered 2013-10-11: 2 g via INTRAVENOUS

## 2013-10-11 MED ORDER — PROMETHAZINE HCL 25 MG/ML IJ SOLN: 6.2500 mg | INTRAMUSCULAR | Status: DC | PRN

## 2013-10-11 MED ORDER — LIDOCAINE HCL (CARDIAC) 20 MG/ML IV SOLN
INTRAVENOUS | Status: AC
  Filled 2013-10-11: qty 5

## 2013-10-11 MED ORDER — BUPIVACAINE LIPOSOME 1.3 % IJ SUSP
INTRAMUSCULAR | Status: DC | PRN
  Administered 2013-10-11: 20 mL

## 2013-10-11 MED ORDER — METOPROLOL SUCCINATE ER 100 MG PO TB24
100.0000 mg | ORAL_TABLET | Freq: Every morning | ORAL | Status: DC
  Administered 2013-10-12 – 2013-10-14 (×3): 100 mg via ORAL
  Filled 2013-10-11 (×3): qty 1

## 2013-10-11 MED ORDER — PROPOFOL INFUSION 10 MG/ML OPTIME
INTRAVENOUS | Status: DC | PRN
  Administered 2013-10-11: 50 ug/kg/min via INTRAVENOUS

## 2013-10-11 MED ORDER — DEXTROSE-NACL 5-0.9 % IV SOLN
INTRAVENOUS | Status: DC
  Administered 2013-10-11: 14:00:00 via INTRAVENOUS

## 2013-10-11 MED ORDER — DEXAMETHASONE SODIUM PHOSPHATE 10 MG/ML IJ SOLN
10.0000 mg | Freq: Every day | INTRAMUSCULAR | Status: AC
Start: 2013-10-12 — End: 2013-10-12
  Filled 2013-10-11: qty 1

## 2013-10-11 MED ORDER — LATANOPROST 0.005 % OP SOLN
1.0000 [drp] | Freq: Every day | OPHTHALMIC | Status: DC
  Administered 2013-10-11 – 2013-10-13 (×3): 1 [drp] via OPHTHALMIC
  Filled 2013-10-11: qty 2.5

## 2013-10-11 MED ORDER — BUPIVACAINE HCL (PF) 0.25 % IJ SOLN
INTRAMUSCULAR | Status: DC | PRN
  Administered 2013-10-11: 30 mL

## 2013-10-11 MED ORDER — MIDAZOLAM HCL 5 MG/5ML IJ SOLN
INTRAMUSCULAR | Status: DC | PRN
  Administered 2013-10-11 (×2): 1 mg via INTRAVENOUS

## 2013-10-11 MED ORDER — FENTANYL CITRATE 0.05 MG/ML IJ SOLN
INTRAMUSCULAR | Status: DC | PRN
  Administered 2013-10-11: 50 ug via INTRAVENOUS

## 2013-10-11 MED ORDER — ACYCLOVIR 800 MG PO TABS
800.0000 mg | ORAL_TABLET | Freq: Two times a day (BID) | ORAL | Status: DC
  Administered 2013-10-11 – 2013-10-14 (×6): 800 mg via ORAL
  Filled 2013-10-11 (×8): qty 1

## 2013-10-11 MED ORDER — DEXAMETHASONE SODIUM PHOSPHATE 10 MG/ML IJ SOLN
10.0000 mg | Freq: Once | INTRAMUSCULAR | Status: AC
  Administered 2013-10-11: 10 mg via INTRAVENOUS

## 2013-10-11 MED ORDER — ACETAMINOPHEN 10 MG/ML IV SOLN
1000.0000 mg | Freq: Once | INTRAVENOUS | Status: AC
  Administered 2013-10-11: 1000 mg via INTRAVENOUS
  Filled 2013-10-11: qty 100

## 2013-10-11 MED ORDER — TRAMADOL HCL 50 MG PO TABS
50.0000 mg | ORAL_TABLET | Freq: Four times a day (QID) | ORAL | Status: DC | PRN
  Administered 2013-10-12 – 2013-10-14 (×5): 100 mg via ORAL
  Filled 2013-10-11 (×5): qty 2

## 2013-10-11 MED ORDER — ONDANSETRON HCL 4 MG/2ML IJ SOLN: 4.0000 mg | Freq: Four times a day (QID) | INTRAMUSCULAR | Status: DC | PRN

## 2013-10-11 MED ORDER — BUTALBITAL-APAP-CAFFEINE 50-325-40 MG PO TABS
1.0000 | ORAL_TABLET | Freq: Every day | ORAL | Status: DC
  Administered 2013-10-13 – 2013-10-14 (×2): 1 via ORAL
  Filled 2013-10-11 (×2): qty 1

## 2013-10-11 MED ORDER — BUPIVACAINE IN DEXTROSE 0.75-8.25 % IT SOLN
INTRATHECAL | Status: DC | PRN
  Administered 2013-10-11: 2 mL via INTRATHECAL

## 2013-10-11 MED ORDER — DIPHENHYDRAMINE HCL 12.5 MG/5ML PO ELIX
12.5000 mg | ORAL_SOLUTION | ORAL | Status: DC | PRN
  Filled 2013-10-11: qty 10

## 2013-10-11 MED ORDER — KETOROLAC TROMETHAMINE 15 MG/ML IJ SOLN: 7.5000 mg | Freq: Four times a day (QID) | INTRAMUSCULAR | Status: AC | PRN

## 2013-10-11 MED ORDER — LIDOCAINE HCL (CARDIAC) 20 MG/ML IV SOLN
INTRAVENOUS | Status: DC | PRN
  Administered 2013-10-11: 100 mg via INTRAVENOUS

## 2013-10-11 MED ORDER — BUPIVACAINE LIPOSOME 1.3 % IJ SUSP
20.0000 mL | Freq: Once | INTRAMUSCULAR | Status: DC
  Filled 2013-10-11: qty 20

## 2013-10-11 SURGICAL SUPPLY — 42 items
BAG ZIPLOCK 12X15 (MISCELLANEOUS) IMPLANT
BLADE EXTENDED COATED 6.5IN (ELECTRODE) ×3 IMPLANT
BLADE SAW SGTL 18X1.27X75 (BLADE) ×2 IMPLANT
BLADE SAW SGTL 18X1.27X75MM (BLADE) ×1
CAPT HIP PF COP ×3 IMPLANT
CLOSURE WOUND 1/2 X4 (GAUZE/BANDAGES/DRESSINGS) ×1
COVER PERINEAL POST (MISCELLANEOUS) ×3 IMPLANT
DECANTER SPIKE VIAL GLASS SM (MISCELLANEOUS) ×3 IMPLANT
DRAPE C-ARM 42X120 X-RAY (DRAPES) ×3 IMPLANT
DRAPE STERI IOBAN 125X83 (DRAPES) ×3 IMPLANT
DRAPE U-SHAPE 47X51 STRL (DRAPES) ×9 IMPLANT
DRSG ADAPTIC 3X8 NADH LF (GAUZE/BANDAGES/DRESSINGS) ×3 IMPLANT
DRSG MEPILEX BORDER 4X4 (GAUZE/BANDAGES/DRESSINGS) ×3 IMPLANT
DRSG MEPILEX BORDER 4X8 (GAUZE/BANDAGES/DRESSINGS) ×3 IMPLANT
DURAPREP 26ML APPLICATOR (WOUND CARE) ×3 IMPLANT
ELECT REM PT RETURN 9FT ADLT (ELECTROSURGICAL) ×3
ELECTRODE REM PT RTRN 9FT ADLT (ELECTROSURGICAL) ×1 IMPLANT
EVACUATOR 1/8 PVC DRAIN (DRAIN) ×3 IMPLANT
FACESHIELD WRAPAROUND (MASK) ×12 IMPLANT
GAUZE SPONGE 4X4 12PLY STRL (GAUZE/BANDAGES/DRESSINGS) IMPLANT
GLOVE BIO SURGEON STRL SZ7.5 (GLOVE) ×3 IMPLANT
GLOVE BIO SURGEON STRL SZ8 (GLOVE) ×6 IMPLANT
GLOVE BIOGEL PI IND STRL 8 (GLOVE) ×2 IMPLANT
GLOVE BIOGEL PI INDICATOR 8 (GLOVE) ×4
GOWN STRL REUS W/TWL LRG LVL3 (GOWN DISPOSABLE) ×3 IMPLANT
GOWN STRL REUS W/TWL XL LVL3 (GOWN DISPOSABLE) ×3 IMPLANT
KIT BASIN OR (CUSTOM PROCEDURE TRAY) ×3 IMPLANT
NDL SAFETY ECLIPSE 18X1.5 (NEEDLE) ×2 IMPLANT
NEEDLE HYPO 18GX1.5 SHARP (NEEDLE) ×4
PACK TOTAL JOINT (CUSTOM PROCEDURE TRAY) ×3 IMPLANT
PADDING CAST COTTON 6X4 STRL (CAST SUPPLIES) ×3 IMPLANT
STRIP CLOSURE SKIN 1/2X4 (GAUZE/BANDAGES/DRESSINGS) ×2 IMPLANT
SUCTION FRAZIER 12FR DISP (SUCTIONS) IMPLANT
SUT ETHIBOND NAB CT1 #1 30IN (SUTURE) ×3 IMPLANT
SUT MNCRL AB 4-0 PS2 18 (SUTURE) ×3 IMPLANT
SUT VIC AB 2-0 CT1 27 (SUTURE) ×4
SUT VIC AB 2-0 CT1 TAPERPNT 27 (SUTURE) ×2 IMPLANT
SUT VLOC 180 0 24IN GS25 (SUTURE) ×3 IMPLANT
SYRINGE 20CC LL (MISCELLANEOUS) ×3 IMPLANT
SYRINGE 60CC LL (MISCELLANEOUS) ×3 IMPLANT
TOWEL OR 17X26 10 PK STRL BLUE (TOWEL DISPOSABLE) ×3 IMPLANT
TRAY FOLEY CATH 14FRSI W/METER (CATHETERS) ×3 IMPLANT

## 2013-10-11 NOTE — Op Note (Signed)
OPERATIVE REPORT  PREOPERATIVE DIAGNOSIS: Osteoarthritis of the Right hip.   POSTOPERATIVE DIAGNOSIS: Osteoarthritis of the Right  hip.   PROCEDURE: Right total hip arthroplasty, anterior approach.   SURGEON: Ollen GrossFrank Aluisio, MD   ASSISTANT: Avel Peacerew perkins, PA-C  ANESTHESIA:  Spinal  ESTIMATED BLOOD LOSS:- 100 ml  DRAINS: Hemovac x1.   COMPLICATIONS: None   CONDITION: PACU - hemodynamically stable.   BRIEF CLINICAL NOTE: Amber [name] is a 73 y.o. female who has advanced end-  stage arthritis of his Right  hip with progressively worsening pain and  dysfunction.The patient has failed nonoperative management and presents for  total hip arthroplasty.   PROCEDURE IN DETAIL: After successful administration of spinal  anesthetic, the traction boots for the [facility] bed were placed on both  feet and the patient was [facility], boots placed into the leg  holders. The Right hip was then isolated from the perineum with plastic  drapes and prepped and draped in the usual sterile fashion. ASIS and  greater trochanter were marked and a oblique incision was made, starting  at about 1 cm lateral and 2 cm distal to the ASIS and coursing towards  the anterior cortex of the femur. The skin was cut with a 10 blade  through subcutaneous tissue to the level of the fascia overlying the  tensor fascia lata muscle. The fascia was then incised in line with the  incision at the junction of the anterior third and posterior 2/3rd. The  muscle was teased off the fascia and then the interval between the TFL  and the rectus was developed. The Hohmann retractor was then placed at  the top of the femoral neck over the capsule. The vessels overlying the  capsule were cauterized and the fat on top of the capsule was removed.  A Hohmann retractor was then placed anterior underneath the rectus  femoris to give exposure to the entire anterior capsule. A T-shaped  capsulotomy was performed.  The edges were tagged and the femoral head  was identified.       Osteophytes are removed off the superior acetabulum.  The femoral neck was then cut in situ with an oscillating saw. Traction  was then applied to the left lower extremity utilizing the [facility]  traction. The femoral head was then removed. Retractors were placed  around the acetabulum and then circumferential removal of the labrum was  performed. Osteophytes were also removed. Reaming starts at 43 mm to  medialize and  Increased in 2 mm increments to 47 mm. We reamed in  approximately 40 degrees of abduction, 20 degrees anteversion. A 48 mm  pinnacle acetabular shell was then impacted in anatomic position under  fluoroscopic guidance with excellent purchase. We did not need to place  any additional dome screws. A 28 mm neutral + 4 marathon liner was then  placed into the acetabular shell.       The femoral lift was then placed along the lateral aspect of the femur  just distal to the vastus ridge. The leg was  externally rotated and capsule  was stripped off the inferior aspect of the femoral neck down to the  level of the lesser trochanter, this was done with electrocautery. The femur was lifted after this was performed. The  leg was then placed and extended in adducted position to essentially delivering the femur. We also removed the capsule superiorly and the  piriformis from the piriformis fossa  to gain excellent exposure of the  proximal femur. Rongeur was used to remove some cancellous bone to get  into the lateral portion of the proximal femur for placement of the  initial starter reamer. The starter broaches was placed  the starter broach  and was shown to go down the center of the canal. Broaching  with the  Corail system was then performed starting at size 8, coursing  Up to size 11. A size 11 had excellent torsional and rotational  and axial stability. The trial standard offset neck was then placed  with a 28 + 1.5  trial head. The hip was then reduced. We confirmed that  the stem was in the canal both on AP and lateral x-rays. It also has excellent sizing. The hip was reduced with outstanding stability through full extension, full external rotation,  and then flexion in adduction internal rotation. AP pelvis was taken  and the leg lengths were measured and found to be exactly equal. Hip  was then dislocated again and the femoral head and neck removed. The  femoral broach was removed. Size 11 Corail stem with a standard offset  neck was then impacted into the femur following native anteversion. Has  excellent purchase in the canal. Excellent torsional and rotational and  axial stability. It is confirmed to be in the canal on AP and lateral  fluoroscopic views. The 28 + 1.5 ceramic head was placed and the hip  reduced with outstanding stability. Again AP pelvis was taken and it  confirmed that the leg lengths were equal. The wound was then copiously  irrigated with saline solution and the capsule reattached and repaired  with Ethibond suture.  20 mL of Exparel mixed with 50 mL of saline then additional 20 ml of .25% Bupivicaine injected into the capsule and into the edge of the tensor fascia lata as well as subcutaneous tissue. The fascia overlying the tensor fascia lata was  then closed with a running #1 V-Loc. Subcu was closed with interrupted  2-0 Vicryl and subcuticular running 4-0 Monocryl. Incision was cleaned  and dried. Steri-Strips and a bulky sterile dressing applied. Hemovac  drain was hooked to suction and then he was awakened and transported to  recovery in stable condition.        Please note that a surgical assistant was a medical necessity for this procedure to perform it in a safe and expeditious manner. Assistant was necessary to provide appropriate retraction of vital neurovascular structures and to prevent femoral fracture and allow for anatomic placement of the prosthesis.  Ollen GrossFrank  Aluisio, M.D.

## 2013-10-11 NOTE — Interval H&P Note (Signed)
History and Physical Interval Note:  10/11/2013 8:45 AM  Alean RinnePatricia [name]  has presented today for surgery, with the diagnosis of right hip osteoarthritis  The various methods of treatment have been discussed with the patient and family. After consideration of risks, benefits and other options for treatment, the patient has consented to  Procedure(s): RIGHT TOTAL HIP ARTHROPLASTY ANTERIOR APPROACH (Right) as a surgical intervention .  The patient's history has been reviewed, patient examined, no change in status, stable for surgery.  I have reviewed the patient's chart and labs.  Questions were answered to the patient's satisfaction.     Loanne DrillingALUISIO,Caidence Kaseman V

## 2013-10-11 NOTE — Anesthesia Preprocedure Evaluation (Addendum)
Anesthesia Evaluation  Patient identified by MRN, date of birth, ID band Patient awake  General Assessment Comment:Problem List/Past Medical  Hip osteoarthritis (715.95  M16.9) Chronic Pain Vertigo Depression Osteoarthritis Osteoporosis Migraine Headache High blood pressure Hypercholesterolemia Glaucoma Cataract Hemorrhoids Menopause   Reviewed: Allergy & Precautions, H&P , NPO status , Patient's Chart, lab work & pertinent test results  Airway Mallampati: II TM Distance: >3 FB Neck ROM: Full    Dental no notable dental hx.    Pulmonary sleep apnea , former smoker,  breath sounds clear to auscultation  Pulmonary exam normal       Cardiovascular hypertension, Pt. on medications and Pt. on home beta blockers Rhythm:Regular Rate:Normal     Neuro/Psych  Headaches, PSYCHIATRIC DISORDERS Depression  Neuromuscular disease    GI/Hepatic Neg liver ROS, GERD-  Medicated,  Endo/Other  negative endocrine ROS  Renal/GU negative Renal ROS  negative genitourinary   Musculoskeletal  (+) Fibromyalgia -, narcotic dependent  Abdominal   Peds negative pediatric ROS (+)  Hematology negative hematology ROS (+)   Anesthesia Other Findings   Reproductive/Obstetrics negative OB ROS                        Anesthesia Physical Anesthesia Plan  ASA: II  Anesthesia Plan: Spinal   Post-op Pain Management:    Induction: Intravenous  Airway Management Planned: Simple Face Mask  Additional Equipment:   Intra-op Plan:   Post-operative Plan: Extubation in OR  Informed Consent: I have reviewed the patients History and Physical, chart, labs and discussed the procedure including the risks, benefits and alternatives for the proposed anesthesia with the patient or authorized representative who has indicated his/her understanding and acceptance.   Dental advisory given  Plan Discussed with: CRNA  Anesthesia  Plan Comments: (Discussed r/b general and spinal. Discussed risks/benefits of spinal including headache, backache, failure, bleeding, infection, and nerve damage. Patient consents to spinal. Questions answered. Coagulation studies and platelet count acceptable.)      Anesthesia Quick Evaluation

## 2013-10-11 NOTE — Progress Notes (Signed)
Clinical Social Work Department BRIEF PSYCHOSOCIAL ASSESSMENT 10/11/2013  Patient:  Amber [name], Amber [name]     Account Number:  [id number]     Admit date:  10/11/2013  Clinical Social Worker:  Lacie Scotts  Date/Time:  10/11/2013 02:34 PM  Referred by:  Physician  Date Referred:  10/11/2013 Referred for  SNF Placement   Other Referral:   Interview type:  Patient Other interview type:    PSYCHOSOCIAL DATA Living Status:  ALONE Admitted from facility:   Level of care:   Primary support name:  Neila Gear Primary support relationship to patient:  CHILD, ADULT Degree of support available:   supportive    CURRENT CONCERNS Current Concerns  Post-Acute Placement   Other Concerns:    SOCIAL WORK ASSESSMENT / PLAN Pt is a 73 yr old female living alone, at home, prior to hospitalization. CSW met with pt / daughter to assist with d/c planning. This is a planned admission. Pt will need ST rehab following hospital d/c. Pt has made plans to have her rehab at [facility] in [place]. CSW has contacted SNF and message has been left to confirm d/c plan. CSW will continue to follow to assist with d/c planning to SNF.   Assessment/plan status:  Psychosocial Support/Ongoing Assessment of Needs Other assessment/ plan:   Information/referral to community resources:   Insurance coverage for SNF and ambulance transport reviewed.    PATIENT'S/FAMILY'S RESPONSE TO PLAN OF CARE: Pt is pleased surgery is completed and that it went well. No complaints of pain. Pt is motivated to start therapy and is looking forward to having rehab at [facility].  Werner Lean LCSW [phone]

## 2013-10-11 NOTE — Anesthesia Postprocedure Evaluation (Signed)
  Anesthesia Post-op Note  Patient: Amber [name]  Procedure(s) Performed: Procedure(s) (LRB): RIGHT TOTAL HIP ARTHROPLASTY ANTERIOR APPROACH (Right)  Patient Location: PACU  Anesthesia Type: Spinal  Level of Consciousness: awake and alert   Airway and Oxygen Therapy: Patient Spontanous Breathing  Post-op Pain: mild  Post-op Assessment: Post-op Vital signs reviewed, Patient's Cardiovascular Status Stable, Respiratory Function Stable, Patent Airway and No signs of Nausea or vomiting  Last Vitals:  Filed Vitals:   10/11/13 1325  BP: 138/90  Pulse: 74  Temp: 36.8 C  Resp: 14    Post-op Vital Signs: stable   Complications: No apparent anesthesia complications

## 2013-10-11 NOTE — Progress Notes (Signed)
Utilization review completed.  

## 2013-10-11 NOTE — Plan of Care (Signed)
Problem: Consults Goal: Diagnosis- Total Joint Replacement Right anterior total hip     

## 2013-10-11 NOTE — Progress Notes (Signed)
X-ray results noted 

## 2013-10-11 NOTE — Anesthesia Procedure Notes (Signed)
Spinal  Patient location during procedure: OR Staffing Anesthesiologist: Salley Scarlet Performed by: anesthesiologist  Preanesthetic Checklist Completed: patient identified, site marked, surgical consent, pre-op evaluation, timeout performed, IV checked, risks and benefits discussed and monitors and equipment checked Spinal Block Patient position: sitting Prep: Betadine Patient monitoring: heart rate, continuous pulse ox and blood pressure Location: L3-4 Injection technique: single-shot Needle Needle type: Spinocan  Needle gauge: 22 G Needle length: 9 cm Additional Notes Expiration date of kit checked and confirmed. Patient tolerated procedure well, without complications. Severe scoliosis noted. No paresthesia. CSF clear.

## 2013-10-11 NOTE — Progress Notes (Signed)
Portable AP Pelvis X-ray done. 

## 2013-10-11 NOTE — H&P (View-Only) (Signed)
Amber [name] DOB: 05/29/1940 Widowed / Language: [place] / Race: White Female Date of Admission:  10-11-2013  Chief Complaint:  Right Hip Pain  History of Present Illness The patient is a 73 year old female who comes in for a preoperative History and Physical. The patient is scheduled for a right total hip arthroplasty (anterior approach) to be performed by Dr. Gus RankinFrank V. Aluisio, [facility] on 10-11-2013. The patient is a 73 year old female who presents with a hip problem. The patient was seen for a second opinion.The patient reports right hip problems including pain symptoms that have been present for year(s). The symptoms began without any known injury. Prior to being seen, the patient was previously evaluated by a colleague. Previous workup for this problem has included pelvic x-rays and hip x-rays (which she had sent to our office/xray film). Previous treatment for this problem has included oral corticosteroids, corticosteroid injection and opioid analgesics. Note for "Hip problem": She said her husband was paralyzed, after a car accident, and she had to care for him for many years. The hip had been hurting at all times. It was in the groin and buttock, radiating down her thigh. It was limiting what she can and cannot do. She says the pain was extremely intense for several months and then she had a recent intraarticular cortisone injection which has helped temporarily, and partially. Her range of motion has been decreased over time. She is not having any back pain, lower extremity weakness or paraesthesias with this. The hip hurts day and night. It is felt that she would benefit from a hip replacement and she has elected to proceed with surgery. They have been treated conservatively in the past for the above stated problem and despite conservative measures, they continue to have progressive pain and severe functional limitations and dysfunction. They have failed  non-operative management including home exercise, medications, and injections. It is felt that they would benefit from undergoing total joint replacement. Risks and benefits of the procedure have been discussed with the patient and they elect to proceed with surgery. There are no active contraindications to surgery such as ongoing infection or rapidly progressive neurological disease.  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 Father.   Social History Marital status. widowed Never consumed alcohol. 06/22/2013: Never consumed alcohol No history of drug/alcohol rehab Current work status. retired Exercise. Exercises never Living situation. live alone Tobacco use. Former smoker. 06/22/2013: smoke(d) 1 pack(s) per day Not under pain contract Number of flights of stairs before winded. 1 Tobacco / smoke exposure. 06/22/2013: no Children. 1 Post-Surgical Plans. [facility] [place], [place] Advance Directives. Living Will, Healthcare POA    Medication History Armour Thyroid (15MG  Tablet, Oral) Active. Omeprazole (40MG  Capsule DR, Oral) Active. Daypro (600MG  Tablet, Oral) Active. Ranitidine HCl (300MG  Tablet, Oral) Active. Fetzima (40MG  Capsule ER 24HR, Oral) Active. Acyclovir (800MG  Tablet, Oral) Active. Metoprolol Succinate ER (100MG  Tablet ER 24HR, Oral) Active. Vitamin A ( Oral) Specific dose unknown - Active. Vitamin B-2 ( Oral) Specific dose unknown - Active. Vitamin B-12 ( Oral) Specific dose unknown - Active. Vitamin D3 ( Oral) Specific dose unknown - Active. Potassium Iodide (Antidote) ( Oral) Specific dose unknown - Active. MAX OX 400mg  Active. Flaxseed Oil ( Oral)  Specific dose unknown - Active.  ALLERDOPHILUS Active. Atorvastatin Calcium (40MG Tablet, Oral) Active. Norco (5-325MG Tablet, Oral) Active. (PRN) Tramadol-Acetaminophen (37.5-325MG Tablet, Oral) Active. (PRN) Fioricet ( Oral) Specific dose unknown - Active. (PRN) Colace (100MG Capsule, Oral) Active. (PRN)    Past Surgical History Tonsillectomy Appendectomy Carpal Tunnel Repair. bilateral   Review of Systems General:Present- Fatigue. Not Present- Chills, Fever, Night Sweats, Weight Gain, Weight Loss and Memory Loss. Skin:Not Present- Hives, Itching, Rash, Eczema and Lesions. HEENT:Present- Headache. Not Present- Tinnitus, Double Vision, Visual Loss, Hearing Loss and Dentures. Respiratory:Not Present- Shortness of breath with exertion, Shortness of breath at rest, Allergies, Coughing up blood and Chronic Cough. Cardiovascular:Not Present- Chest Pain, Racing/skipping heartbeats, Difficulty Breathing Lying Down, Murmur, Swelling and Palpitations. Gastrointestinal:Present- Constipation. Not Present- Bloody Stool, Heartburn, Abdominal Pain, Vomiting, Nausea, Diarrhea, Difficulty Swallowing, Jaundice and Loss of appetitie. Female Genitourinary:Present- Urinary frequency and Urinating at Night. Not Present- Blood in Urine, Weak urinary stream, Discharge, Flank Pain, Incontinence, Painful Urination, Urgency and Urinary Retention. Musculoskeletal:Present- Joint Pain and Morning Stiffness. Not Present- Muscle Weakness, Muscle Pain, Joint Swelling, Back Pain and Spasms. Neurological:Not Present- Tremor, Dizziness, Blackout spells, Paralysis, Difficulty with balance and Weakness. Psychiatric:Not Present- Insomnia.    Vitals Weight: 118 lb Height: 61 in Weight was reported by patient. Height was reported by patient. Body Surface Area: 1.52 m Body Mass Index: 22.3 kg/m BP: 138/78 (Sitting, Right Arm, Standard)    Physical Exam The physical exam findings are as  follows:  Patient is a 73 year old female with continued hip pain. She is accompanied by her daughter, Ann.   General Mental Status - Alert, cooperative and good historian. General Appearance- pleasant. Not in acute distress. Orientation- Oriented X3. Build & Nutrition- Well nourished and Well developed.   Head and Neck Head- normocephalic, atraumatic . Neck Global Assessment- supple. no bruit auscultated on the right and no bruit auscultated on the left.   Eye Vision- Wears corrective lenses. Pupil- Bilateral- Regular and Round. Motion- Bilateral- EOMI.   Chest and Lung Exam Auscultation: Breath sounds:- clear at anterior chest wall and - clear at posterior chest wall. Adventitious sounds:- No Adventitious sounds.   Cardiovascular Auscultation:Rhythm- Regular rate and rhythm. Heart Sounds- S1 WNL and S2 WNL. Murmurs & Other Heart Sounds:Auscultation of the heart reveals - No Murmurs.   Abdomen Palpation/Percussion:Tenderness- Abdomen is non-tender to palpation. Rigidity (guarding)- Abdomen is soft. Auscultation:Auscultation of the abdomen reveals - Bowel sounds normal.   Female Genitourinary Not done, not pertinent to present illness  Musculoskeletal Musculoskeletal: Evaluation of her left hip shows normal range of motion. no discomfort. Right hip flexion about 90 minimal internal rotation, about 20 external rotation, 30 abduction.  X-RAYS: AP pelvis and lateral of the hip that she brought with her today showed bone on bone arthritis in the right hip with subchondral cyst formation. Left hip shows minimal narrowing.   Assessment & Plan Hip osteoarthritis (715.95  M16.9) Impression: Right Hip  Note: Plan is for a Right Total Hip Replacement - Anterior Approach by Dr. Aluisio.  Plan is to go to SNF. She wants to look into [facility] in [place], [place] Contact Admissions Coordinator:  Sarah  Blair Phone [phone] Fax [phone]  PCP - Dr. Caplan - Patient has been seen preoperatively and felt to be stable for surgery.  The patient does not have any contraindications and will receive TXA (tranexamic acid) prior to surgery.  Signed electronically by Alexzandrew L Perkins, III PA-C  

## 2013-10-11 NOTE — Evaluation (Signed)
Physical Therapy Evaluation Patient Details Name: Amber [name] MRN: [id number] DOB: 11/14/1940 Today's Date: 10/11/2013   History of Present Illness  RDATHA  Clinical Impression  Pt  Pivoted to recliner and back to bed . R LE not supportive. Pt will benefit from PT to address problems listed in note below.     Follow Up Recommendations SNF    Equipment Recommendations  Rolling walker with 5" wheels    Recommendations for Other Services       Precautions / Restrictions Precautions Precautions: Fall Restrictions Weight Bearing Restrictions: No      Mobility  Bed Mobility Overal bed mobility: Needs Assistance Bed Mobility: Supine to Sit;Sit to Supine     Supine to sit: Min assist Sit to supine: Min assist   General bed mobility comments: cues for sequence, assist for R leg onto bed.  Transfers Overall transfer level: Needs assistance Equipment used: Rolling walker (2 wheeled) Transfers: Sit to/from [employer]Stand;Stand Pivot Transfers Sit to Stand: Mod assist;+2 safety/equipment Stand pivot transfers: Mod assist;+2 safety/equipment       General transfer comment: R leg not supporting in stance  Ambulation/Gait                Stairs            Wheelchair Mobility    Modified Rankin (Stroke Patients Only)       Balance                                             Pertinent Vitals/Pain "burning in thigh"    Home Living Family/patient expects to be discharged to:: Skilled nursing facility Living Arrangements: Alone                    Prior Function Level of Independence: Independent               Hand Dominance        Extremity/Trunk Assessment   Upper Extremity Assessment: Overall WFL for tasks assessed           Lower Extremity Assessment: RLE deficits/detail RLE Deficits / Details: pt had R leg in flexed position in bed, decreased weight on R leg       Communication   Communication: No  difficulties  Cognition Arousal/Alertness: Awake/alert Behavior During Therapy: WFL for tasks assessed/performed Overall Cognitive Status: Within Functional Limits for tasks assessed                      General Comments      Exercises        Assessment/Plan    PT Assessment Patient needs continued PT services  PT Diagnosis Difficulty walking;Acute pain   PT Problem List Decreased strength;Decreased activity tolerance;Decreased mobility;Decreased knowledge of precautions;Decreased knowledge of use of DME;Decreased safety awareness;Pain  PT Treatment Interventions DME instruction;Gait training;Functional mobility training;Therapeutic activities;Therapeutic exercise;Patient/family education   PT Goals (Current goals can be found in the Care Plan section) Acute Rehab PT Goals Patient Stated Goal: I want to try to get up PT Goal Formulation: With patient/family Time For Goal Achievement: 10/16/13 Potential to Achieve Goals: Good    Frequency 7X/week   Barriers to discharge        Co-evaluation               End of Session   Activity Tolerance: Patient tolerated treatment  well Patient left: in bed;with call bell/phone within reach;with family/visitor present Nurse Communication: Mobility status         Time: [id number] PT Time Calculation (min): 32 min   Charges:   PT Evaluation $Initial PT Evaluation Tier I: 1 Procedure PT Treatments $Therapeutic Activity: 23-37 mins   PT G Codes:          Rada HayHill, Karen Elizabeth 10/11/2013, 4:47 PM

## 2013-10-11 NOTE — Transfer of Care (Signed)
Immediate Anesthesia Transfer of Care Note  Patient: Amber [name]  Procedure(s) Performed: Procedure(s): RIGHT TOTAL HIP ARTHROPLASTY ANTERIOR APPROACH (Right)  Patient Location: PACU  Anesthesia Type:MAC and Spinal  Level of Consciousness: awake, alert , oriented and patient cooperative  Airway & Oxygen Therapy: Patient Spontanous Breathing and Patient connected to face mask oxygen  Post-op Assessment: Report given to PACU RN and Post -op Vital signs reviewed and stable  Post vital signs: Reviewed and stable  Complications: No apparent anesthesia complications

## 2013-10-11 NOTE — Progress Notes (Signed)
Clinical Social Work Department CLINICAL SOCIAL WORK PLACEMENT NOTE 10/11/2013  Patient:  Amber RinneGLENN,Amber  Account Number:  [id number] Admit date:  10/11/2013  Clinical Social Worker:  Cori RazorJAMIE HAIDINGER, LCSW  Date/time:  10/11/2013 02:52 PM  Clinical Social Work is seeking post-discharge placement for this patient at the following level of care:   SKILLED NURSING   (*CSW will update this form in Epic as items are completed)     Patient/family provided with [facility] Department of Clinical Social Work's list of facilities offering this level of care within the geographic area requested by the patient (or if unable, by the patient's family).  10/11/2013  Patient/family informed of their freedom to choose among providers that offer the needed level of care, that participate in Medicare, Medicaid or managed care program needed by the patient, have an available bed and are willing to accept the patient.    Patient/family informed of [facility]' ownership interest in [facility], as well as of the fact that they are under no obligation to receive care at this facility.  PASARR submitted to EDS on  PASARR number received on   FL2 transmitted to all facilities in geographic area requested by pt/family on  10/11/2013 FL2 transmitted to all facilities within larger geographic area on   Patient informed that his/her managed care company has contracts with or will negotiate with  certain facilities, including the following:     Patient/family informed of bed offers received:   Patient chooses bed at  Physician recommends and patient chooses bed at    Patient to be transferred to  on   Patient to be transferred to facility by  Patient and family notified of transfer on  Name of family member notified:    The following physician request were entered in Epic:   Additional Comments:  Cori RazorJamie Haidinger LCSW [phone]

## 2013-10-12 LAB — BASIC METABOLIC PANEL
BUN: 13 mg/dL (ref 6–23)
CALCIUM: 8.3 mg/dL — AB (ref 8.4–10.5)
CO2: 25 meq/L (ref 19–32)
Chloride: 103 mEq/L (ref 96–112)
Creatinine, Ser: 0.58 mg/dL (ref 0.50–1.10)
GFR calc non Af Amer: 89 mL/min — ABNORMAL LOW (ref >90)
Glucose, Bld: 91 mg/dL (ref 70–99)
Potassium: 3.9 mEq/L (ref 3.7–5.3)
SODIUM: 139 meq/L (ref 137–147)

## 2013-10-12 LAB — CBC
HCT: 32.9 % — ABNORMAL LOW (ref 36.0–46.0)
Hemoglobin: 11 g/dL — ABNORMAL LOW (ref 12.0–15.0)
MCH: 33.4 pg (ref 26.0–34.0)
MCHC: 33.4 g/dL (ref 30.0–36.0)
MCV: 100 fL (ref 78.0–100.0)
Platelets: 197 10*3/uL (ref 150–400)
RBC: 3.29 MIL/uL — AB (ref 3.87–5.11)
RDW: 13 % (ref 11.5–15.5)
WBC: 10 10*3/uL (ref 4.0–10.5)

## 2013-10-12 MED ORDER — HYDROCODONE-ACETAMINOPHEN 10-325 MG PO TABS: 1.0000 | ORAL_TABLET | ORAL | Status: DC | PRN

## 2013-10-12 MED ORDER — HYDROCODONE-ACETAMINOPHEN 7.5-325 MG PO TABS: 1.0000 | ORAL_TABLET | Freq: Four times a day (QID) | ORAL | Status: DC | PRN

## 2013-10-12 MED ORDER — OMEPRAZOLE 20 MG PO CPDR
40.0000 mg | DELAYED_RELEASE_CAPSULE | Freq: Every day | ORAL | Status: DC
  Administered 2013-10-12 – 2013-10-14 (×3): 40 mg via ORAL
  Filled 2013-10-12 (×3): qty 2

## 2013-10-12 NOTE — Progress Notes (Signed)
Paper Copy of living will,adv directive [facility] POA is in front of pts chart Amber CustardBeverly, Amber [name]

## 2013-10-12 NOTE — Progress Notes (Signed)
Physical Therapy Treatment Patient Details Name: Alean Rinneatricia Stillinger MRN: [id number] DOB: 10/11/1940 Today's Date: 10/12/2013    History of Present Illness RDATHA    PT Comments    Pt c/o pain in R hip but  Did ambulate well.reported pain was less walking.  Follow Up Recommendations  SNF     Equipment Recommendations  Rolling walker with 5" wheels    Recommendations for Other Services       Precautions / Restrictions Precautions Precautions: Fall    Mobility  Bed Mobility Overal bed mobility: Needs Assistance Bed Mobility: Supine to Sit     Supine to sit: Min assist     General bed mobility comments: extra time with increased pain of R thigh  Transfers Overall transfer level: Needs assistance Equipment used: Rolling walker (2 wheeled) Transfers: Sit to/from Stand Sit to Stand: Min assist         General transfer comment: cues for UE positiion , hand palcement  Ambulation/Gait Ambulation/Gait assistance: Min assist Ambulation Distance (Feet): 45 Feet Assistive device: Rolling walker (2 wheeled) Gait Pattern/deviations: Step-to pattern;Step-through pattern;Antalgic     General Gait Details: cues for sequence, for posture   Stairs            Wheelchair Mobility    Modified Rankin (Stroke Patients Only)       Balance                                    Cognition Arousal/Alertness: Awake/alert Behavior During Therapy: WFL for tasks assessed/performed                        Exercises Total Joint Exercises Ankle Circles/Pumps: AROM;Both;10 reps;Supine Short Arc Quad: AROM;Right;10 reps;Supine Heel Slides: AAROM;Right;10 reps;Supine Hip ABduction/ADduction: AAROM;Right;10 reps;Supine    General Comments        Pertinent Vitals/Pain 4  Had medication, ice  To r thigh    Home Living                      Prior Function            PT Goals (current goals can now be found in the care plan section)  Progress towards PT goals: Progressing toward goals    Frequency  7X/week    PT Plan Current plan remains appropriate    Co-evaluation             End of Session           Time: 4098-11911046-1122 PT Time Calculation (min): 36 min  Charges:  $Gait Training: 8-22 mins $Therapeutic Exercise: 8-22 mins                    G Codes:      Rada HayHill, Karen Elizabeth 10/12/2013, 12:45 PM

## 2013-10-12 NOTE — Progress Notes (Signed)
   Subjective: 1 Day Post-Op Procedure(s) (LRB): RIGHT TOTAL HIP ARTHROPLASTY ANTERIOR APPROACH (Right) Patient reports pain as mild.   Patient seen in rounds with Dr. Lequita HaltAluisio.  Daughter Dewayne Hatchnn in room at bedside. Patient is well, but has had some minor complaints of pain in the hip and knees, requiring pain medications We will start therapy today.  Plan is to go Skilled nursing facility after hospital stay.  Events of last night have been reviewed.  She was given 2 oxycodone last night and became disoriented and apparently tried to get up and slipped down onto the floor.  She was found sitting on her bottom by the bed as per the staff.  Daughter in room at the time.  Denied any injuires at the time.  Assisted back to bed and oxycodone was discontinued.Encourage tramadol and norco as a backup.  She was seen and examined by Dr. Lequita HaltAluisio this morning on rounds and appears to be okay and stable.  Objective: Vital signs in last 24 hours: Temp:  [97.4 F (36.3 C)-98.9 F (37.2 C)] 97.4 F (36.3 C) (06/25 0621) Pulse Rate:  [58-95] 95 (06/25 0621) Resp:  [8-20] 16 (06/25 0621) BP: (98-157)/(55-99) 157/76 mmHg (06/25 0621) SpO2:  [91 %-100 %] 91 % (06/25 0621) Weight:  [52.617 kg (116 lb)] 52.617 kg (116 lb) (06/24 1225)  Intake/Output from previous day:  Intake/Output Summary (Last 24 hours) at 10/12/13 0749 Last data filed at 10/12/13 [id number]  Gross per 24 hour  Intake 3073.75 ml  Output   3060 ml  Net  13.75 ml    Intake/Output this shift:    Labs:  Recent Labs  10/12/13 0439  HGB 11.0*    Recent Labs  10/12/13 0439  WBC 10.0  RBC 3.29*  HCT 32.9*  PLT 197    Recent Labs  10/12/13 0439  NA 139  K 3.9  CL 103  CO2 25  BUN 13  CREATININE 0.58  GLUCOSE 91  CALCIUM 8.3*   No results found for this basename: LABPT, INR,  in the last 72 hours  EXAM General - Patient is Alert and Appropriate Extremity - Neurovascular intact Sensation intact distally Dressing -  dressing C/D/I Motor Function - intact, moving foot and toes well on exam.  Hemovac pulled without difficulty.  Past Medical History  Diagnosis Date  . GERD (gastroesophageal reflux disease)   . Hypertension   . Fibromyalgia   . Headache(784.0)   . Arthritis   . Depression   . Sleep apnea     diagnosed years ago but does not use CPAP    Assessment/Plan: 1 Day Post-Op Procedure(s) (LRB): RIGHT TOTAL HIP ARTHROPLASTY ANTERIOR APPROACH (Right) Principal Problem:   OA (osteoarthritis) of hip  Estimated body mass index is 21.93 kg/(m^2) as calculated from the following:   Height as of this encounter: 5\' 1"  (1.549 m).   Weight as of this encounter: 52.617 kg (116 lb). Advance diet Up with therapy Discharge to SNF  DVT Prophylaxis - Xarelto Weight Bearing As Tolerated right Leg Hemovac Pulled Begin Therapy  Avel Peacerew Perkins, PA-C Orthopaedic Surgery 10/12/2013, 7:49 AM

## 2013-10-12 NOTE — Progress Notes (Signed)
OT Cancellation Note  Patient Details Name: Alean Rinneatricia Guzek MRN: [id number] DOB: 08/07/1940   Cancelled Treatment:    Reason Eval/Treat Not Completed: Other (comment) Will defer OT eval to SNF.  Lennox LaityStone, Stephanie Stafford [phone] 10/12/2013, 9:10 AM

## 2013-10-12 NOTE — Progress Notes (Signed)
10/12/13 0310  What Happened  Was fall witnessed? No  Was patient injured? No  Patient found on floor (found sitting on floor left side of bed denies injury)  Found by (family in room with patient,dtr  at bedside time of incident)  Stated prior activity other (comment) (sleeping in bed SR up x 3)  Follow Up  MD notified Kenard Gower(Drew perkins PA of Dr Charlestine NightAlusio,prn med orders changed)  Family notified Yes-comment (present)  Time family notified 0310  Additional tests No  Vitals  Temp 97.8 F (36.6 C)  Temp src Oral  BP 117/66 mmHg  BP Location Left arm  BP Method Automatic  Patient Position (if appropriate) Lying  Pulse Rate 70  Pulse Rate Source Monitor  Resp 20  Oxygen Therapy  SpO2 100 %  O2 Device Nasal cannula  O2 Flow Rate (L/min) 2 L/min  Pain Assessment  Pain Score 0

## 2013-10-13 LAB — CBC
HCT: 34.7 % — ABNORMAL LOW (ref 36.0–46.0)
Hemoglobin: 11.3 g/dL — ABNORMAL LOW (ref 12.0–15.0)
MCH: 32.1 pg (ref 26.0–34.0)
MCHC: 32.6 g/dL (ref 30.0–36.0)
MCV: 98.6 fL (ref 78.0–100.0)
Platelets: 187 10*3/uL (ref 150–400)
RBC: 3.52 MIL/uL — ABNORMAL LOW (ref 3.87–5.11)
RDW: 13.2 % (ref 11.5–15.5)
WBC: 9 10*3/uL (ref 4.0–10.5)

## 2013-10-13 LAB — BASIC METABOLIC PANEL
BUN: 12 mg/dL (ref 6–23)
CALCIUM: 8.8 mg/dL (ref 8.4–10.5)
CHLORIDE: 104 meq/L (ref 96–112)
CO2: 27 mEq/L (ref 19–32)
CREATININE: 0.51 mg/dL (ref 0.50–1.10)
Glucose, Bld: 98 mg/dL (ref 70–99)
Potassium: 3.5 mEq/L — ABNORMAL LOW (ref 3.7–5.3)
Sodium: 142 mEq/L (ref 137–147)

## 2013-10-13 MED ORDER — POTASSIUM CHLORIDE CRYS ER 20 MEQ PO TBCR
40.0000 meq | EXTENDED_RELEASE_TABLET | Freq: Every day | ORAL | Status: DC
  Administered 2013-10-13 – 2013-10-14 (×2): 40 meq via ORAL
  Filled 2013-10-13 (×2): qty 2

## 2013-10-13 MED ORDER — TRAMADOL-ACETAMINOPHEN 37.5-325 MG PO TABS: 2.0000 | ORAL_TABLET | Freq: Every day | ORAL | Status: DC | PRN

## 2013-10-13 MED ORDER — METHOCARBAMOL 500 MG PO TABS: 500.0000 mg | ORAL_TABLET | Freq: Four times a day (QID) | ORAL | Status: DC | PRN

## 2013-10-13 MED ORDER — RIVAROXABAN 10 MG PO TABS: 10.0000 mg | ORAL_TABLET | Freq: Every day | ORAL | Status: DC

## 2013-10-13 MED ORDER — HYDROCODONE-ACETAMINOPHEN 10-325 MG PO TABS: 1.0000 | ORAL_TABLET | ORAL | Status: DC | PRN

## 2013-10-13 NOTE — Progress Notes (Signed)
Tentative d/c summary and [facility]. Weekend CSW will assist with d/c planning to SNF.  Cori RazorJamie Haidinger LCSW [phone]

## 2013-10-13 NOTE — Discharge Instructions (Signed)
°Dr. Frank Aluisio °Total Joint Specialist °[facility] °[address] °[place], [place] [address] °[phone] ° ° ° °ANTERIOR APPROACH TOTAL HIP REPLACEMENT POSTOPERATIVE DIRECTIONS ° ° °Hip Rehabilitation, Guidelines Following Surgery  °The results of a hip operation are greatly improved after range of motion and muscle strengthening exercises. Follow all safety measures which are given to protect your hip. If any of these exercises cause increased pain or swelling in your joint, decrease the amount until you are comfortable again. Then slowly increase the exercises. Call your caregiver if you have problems or questions.  °HOME CARE INSTRUCTIONS  °Most of the following instructions are designed to prevent the dislocation of your new hip.  °Remove items at home which could result in a fall. This includes throw rugs or furniture in walking pathways.  °Continue medications as instructed at time of discharge. °· You may have some home medications which will be placed on hold until you complete the course of blood thinner medication. °· You may start showering once you are discharged home but do not submerge the incision under water. Just pat the incision dry and apply a dry gauze dressing on daily. °Do not put on socks or shoes without following the instructions of your caregivers.  °Sit on high chairs which makes it easier to stand.  °Sit on chairs with arms. Use the chair arms to help push yourself up when arising.  °Keep your leg on the side of the operation out in front of you when standing up.  °Arrange for the use of a toilet seat elevator so you are not sitting low.   °· Walk with walker as instructed.  °You may resume a sexual relationship in one month or when given the OK by your caregiver.  °Use walker as long as suggested by your caregivers.  °You may put full weight on your legs and walk as much as is comfortable. °Avoid periods of inactivity such as sitting longer than an hour  when not asleep. This helps prevent blood clots.  °You may return to work once you are cleared by your surgeon.  °Do not drive a car for 6 weeks or until released by your surgeon.  °Do not drive while taking narcotics.  °Wear elastic stockings for three weeks following surgery during the day but you may remove then at night.  °Make sure you keep all of your appointments after your operation with all of your doctors and caregivers. You should call the office at the above phone number and make an appointment for approximately two weeks after the date of your surgery. °Change the dressing daily and reapply a dry dressing each time. °Please pick up a stool softener and laxative for home use as long as you are requiring pain medications. °· Continue to use ice on the hip for pain and swelling from surgery. You may notice swelling that will progress down to the foot and ankle.  This is normal after  surgery.  Elevate the leg when you are not up walking on it.   °It is important for you to complete the blood thinner medication as prescribed by your doctor. °· Continue to use the breathing machine which will help keep your temperature down.  It is common for your temperature to cycle up and down following surgery, especially at night when you are not up moving around and exerting yourself.  The breathing machine keeps your lungs expanded and your temperature down. ° °RANGE OF MOTION AND STRENGTHENING EXERCISES  °  These exercises are designed to help you keep full movement of your hip joint. Follow your caregiver's or physical therapist's instructions. Perform all exercises about fifteen times, three times per day or as directed. Exercise both hips, even if you have had only one joint replacement. These exercises can be done on a training (exercise) mat, on the floor, on a table or on a bed. Use whatever works the best and is most comfortable for you. Use music or television while you are exercising so that the exercises are  a pleasant break in your day. This will make your life better with the exercises acting as a break in routine you can look forward to.  Lying on your back, slowly slide your foot toward your buttocks, raising your knee up off the floor. Then slowly slide your foot back down until your leg is straight again.  Lying on your back spread your legs as far apart as you can without causing discomfort.  Lying on your side, raise your upper leg and foot straight up from the floor as far as is comfortable. Slowly lower the leg and repeat.  Lying on your back, tighten up the muscle in the front of your thigh (quadriceps muscles). You can do this by keeping your leg straight and trying to raise your heel off the floor. This helps strengthen the largest muscle supporting your knee.  Lying on your back, tighten up the muscles of your buttocks both with the legs straight and with the knee bent at a comfortable angle while keeping your heel on the floor.   SKILLED REHAB INSTRUCTIONS: If the patient is transferred to a skilled rehab facility following release from the hospital, a list of the current medications will be sent to the facility for the patient to continue.  When discharged from the skilled rehab facility, please have the facility set up the patient's Valeria prior to being released. Also, the skilled facility will be responsible for providing the patient with their medications at time of release from the facility to include their pain medication, the muscle relaxants, and their blood thinner medication. If the patient is still at the rehab facility at time of the two week follow up appointment, the skilled rehab facility will also need to assist the patient in arranging follow up appointment in our office and any transportation needs.  MAKE SURE YOU:  Understand these instructions.  Will watch your condition.  Will get help right away if you are not doing well or get worse.  Pick up  stool softner and laxative for home. Do not submerge incision under water. May shower. Continue to use ice for pain and swelling from surgery. Total Hip Protocol.  Take Xarelto for two and a half more weeks, then discontinue Xarelto. Once the patient has completed the blood thinner regimen, then take a Baby 81 mg Aspirin daily for three more weeks.  When discharged from the skilled rehab facility, please have the facility set up the patient's [place] prior to being released.  Also provide the patient with their medications at time of release from the facility to include their pain medication, the muscle relaxants, and their blood thinner medication.  If the patient is still at the rehab facility at time of follow up appointment, please also assist the patient in arranging follow up appointment in our office and any transportation needs.   Information on my medicine - XARELTO (Rivaroxaban)  This medication education was reviewed with  me or my healthcare representative as part of my discharge preparation.  The pharmacist that spoke with me during my hospital stay was:  Otho BellowsGreen, Terri L, [facility]  Why was Xarelto prescribed for you? Xarelto was prescribed for you to reduce the risk of blood clots forming after orthopedic surgery. The medical term for these abnormal blood clots is venous thromboembolism (VTE).  What do you need to know about xarelto ? Take your Xarelto ONCE DAILY at the same time every day. You may take it either with or without food.  If you have difficulty swallowing the tablet whole, you may crush it and mix in applesauce just prior to taking your dose.  Take Xarelto exactly as prescribed by your doctor and DO NOT stop taking Xarelto without talking to the doctor who prescribed the medication.  Stopping without other VTE prevention medication to take the place of Xarelto may increase your risk of developing a clot.  After discharge, you should have  regular check-up appointments with your healthcare provider that is prescribing your Xarelto.    What do you do if you miss a dose? If you miss a dose, take it as soon as you remember on the same day then continue your regularly scheduled once daily regimen the next day. Do not take two doses of Xarelto on the same day.   Important Safety Information A possible side effect of Xarelto is bleeding. You should call your healthcare provider right away if you experience any of the following:   Bleeding from an injury or your nose that does not stop.   Unusual colored urine (red or dark brown) or unusual colored stools (red or black).   Unusual bruising for unknown reasons.   A serious fall or if you hit your head (even if there is no bleeding).  Some medicines may interact with Xarelto and might increase your risk of bleeding while on Xarelto. To help avoid this, consult your healthcare provider or pharmacist prior to using any new prescription or non-prescription medications, including herbals, vitamins, non-steroidal anti-inflammatory drugs (NSAIDs) and supplements.  This website has more information on Xarelto: [email or web address].xarelto.[email or web address].

## 2013-10-13 NOTE — Progress Notes (Signed)
Physical Therapy Treatment Patient Details Name: Amber [name] MRN: [id number] DOB: 07/07/1940 Today's Date: 10/13/2013    History of Present Illness      PT Comments      Follow Up Recommendations  SNF     Equipment Recommendations  Rolling walker with 5" wheels    Recommendations for Other Services       Precautions / Restrictions Precautions Precautions: Fall Restrictions Weight Bearing Restrictions: No Other Position/Activity Restrictions: WBAT    Mobility  Bed Mobility Overal bed mobility: Needs Assistance Bed Mobility: Sit to Supine       Sit to supine: Min assist   General bed mobility comments: cues for sequence and use of L  LE to self assist  Transfers Overall transfer level: Needs assistance Equipment used: Rolling walker (2 wheeled) Transfers: Sit to/from Stand Sit to Stand: Min assist         General transfer comment: cues for LE management and use of UEs to self assist  Ambulation/Gait Ambulation/Gait assistance: Min assist Ambulation Distance (Feet): 98 Feet Assistive device: Rolling walker (2 wheeled) Gait Pattern/deviations: Step-to pattern;Shuffle;Trunk flexed     General Gait Details: cues for sequence, posture,, stride length, BOS, and position from Rohm and HaasW   Stairs            Wheelchair Mobility    Modified Rankin (Stroke Patients Only)       Balance                                    Cognition Arousal/Alertness: Awake/alert Behavior During Therapy: WFL for tasks assessed/performed Overall Cognitive Status: Within Functional Limits for tasks assessed                      Exercises Total Joint Exercises Ankle Circles/Pumps: AROM;Both;Supine;15 reps Quad Sets: AROM;Both;10 reps;Supine Heel Slides: AAROM;Right;Supine;15 reps Hip ABduction/ADduction: AAROM;Right;Supine;15 reps    General Comments        Pertinent Vitals/Pain 5/10; premed, ice packs provided    Home Living                      Prior Function            PT Goals (current goals can now be found in the care plan section) Acute Rehab PT Goals Patient Stated Goal: Walk without pain PT Goal Formulation: With patient/family Time For Goal Achievement: 10/16/13 Potential to Achieve Goals: Good Progress towards PT goals: Progressing toward goals    Frequency  7X/week    PT Plan Current plan remains appropriate    Co-evaluation             End of Session Equipment Utilized During Treatment: Gait belt Activity Tolerance: Patient tolerated treatment well Patient left: in bed;with call bell/phone within reach;with family/visitor present     Time: [id number] PT Time Calculation (min): 26 min  Charges:  $Gait Training: 8-22 mins $Therapeutic Exercise: 8-22 mins                    G Codes:      Amber [name],Amber [name] 10/13/2013, 3:36 PM

## 2013-10-13 NOTE — Progress Notes (Signed)
   Subjective: 2 Days Post-Op Procedure(s) (LRB): RIGHT TOTAL HIP ARTHROPLASTY ANTERIOR APPROACH (Right) Patient reports pain as mild.   Patient seen in rounds with Dr. Lequita HaltAluisio. Patient is well, and has had no acute complaints or problems We will start therapy today.  Plan is to go Skilled nursing facility after hospital stay.  Objective: Vital signs in last 24 hours: Temp:  [98.7 F (37.1 C)-100 F (37.8 C)] 98.7 F (37.1 C) (06/26 0512) Pulse Rate:  [82-87] 85 (06/26 0512) Resp:  [16-18] 16 (06/26 1100) BP: (122-154)/(68-73) 154/73 mmHg (06/26 0512) SpO2:  [96 %-97 %] 96 % (06/26 0512)  Intake/Output from previous day:  Intake/Output Summary (Last 24 hours) at 10/13/13 1332 Last data filed at 10/13/13 1129  Gross per 24 hour  Intake    840 ml  Output   1300 ml  Net   -460 ml    Intake/Output this shift: Total I/O In: 480 [P.O.:480] Out: 300 [Urine:300]  Labs:  Recent Labs  10/12/13 0439 10/13/13 0414  HGB 11.0* 11.3*    Recent Labs  10/12/13 0439 10/13/13 0414  WBC 10.0 9.0  RBC 3.29* 3.52*  HCT 32.9* 34.7*  PLT 197 187    Recent Labs  10/12/13 0439 10/13/13 0414  NA 139 142  K 3.9 3.5*  CL 103 104  CO2 25 27  BUN 13 12  CREATININE 0.58 0.51  GLUCOSE 91 98  CALCIUM 8.3* 8.8   No results found for this basename: LABPT, INR,  in the last 72 hours  EXAM General - Patient is Alert and Appropriate Extremity - Neurovascular intact Sensation intact distally Dorsiflexion/Plantar flexion intact Dressing - dressing C/D/I Motor Function - intact, moving foot and toes well on exam.   Past Medical History  Diagnosis Date  . GERD (gastroesophageal reflux disease)   . Hypertension   . Fibromyalgia   . Headache(784.0)   . Arthritis   . Depression   . Sleep apnea     diagnosed years ago but does not use CPAP    Assessment/Plan: 2 Days Post-Op Procedure(s) (LRB): RIGHT TOTAL HIP ARTHROPLASTY ANTERIOR APPROACH (Right) Principal Problem:   OA  (osteoarthritis) of hip  Estimated body mass index is 21.93 kg/(m^2) as calculated from the following:   Height as of this encounter: 5\' 1"  (1.549 m).   Weight as of this encounter: 52.617 kg (116 lb). Up with therapy Plan for discharge tomorrow Discharge to SNF  DVT Prophylaxis - Xarelto Weight Bearing As Tolerated right Leg [facility]  Avel Peacerew Perkins, [place]-C Orthopaedic Surgery 10/13/2013, 1:32 PM

## 2013-10-13 NOTE — Discharge Summary (Signed)
Physician Discharge Summary   Patient ID: Amber [name] MRN: [id number] DOB/AGE: 73-Aug-1942 73 y.o.  Admit date: 10/11/2013 Discharge date: Tentative Date of Discharge - Saturday - 10/14/2013  Primary Diagnosis:  Osteoarthritis of the Right hip  Admission Diagnoses:  Past Medical History  Diagnosis Date  . GERD (gastroesophageal reflux disease)   . Hypertension   . Fibromyalgia   . Headache(784.0)   . Arthritis   . Depression   . Sleep apnea     diagnosed years ago but does not use CPAP   Discharge Diagnoses:   Principal Problem:   OA (osteoarthritis) of hip  Estimated body mass index is 21.93 kg/(m^2) as calculated from the following:   Height as of this encounter: $RemoveBeforeD'5\' 1"'OlsivkqrUuyXuw$  (1.549 m).   Weight as of this encounter: 52.617 kg (116 lb).  Procedure(s) (LRB): RIGHT TOTAL HIP ARTHROPLASTY ANTERIOR APPROACH (Right)   Consults: None  HPI: Amber [name] is a 73 y.o. female who has advanced end-  stage arthritis of his Right hip with progressively worsening pain and  dysfunction.The patient has failed nonoperative management and presents for  total hip arthroplasty.   Laboratory Data: Admission on 10/11/2013  Component Date Value Ref Range Status  . WBC 10/12/2013 10.0  4.0 - 10.5 K/uL Final  . RBC 10/12/2013 3.29* 3.87 - 5.11 MIL/uL Final  . Hemoglobin 10/12/2013 11.0* 12.0 - 15.0 g/dL Final  . HCT 10/12/2013 32.9* 36.0 - 46.0 % Final  . MCV 10/12/2013 100.0  78.0 - 100.0 fL Final  . MCH 10/12/2013 33.4  26.0 - 34.0 pg Final  . MCHC 10/12/2013 33.4  30.0 - 36.0 g/dL Final  . RDW 10/12/2013 13.0  11.5 - 15.5 % Final  . Platelets 10/12/2013 197  150 - 400 K/uL Final  . Sodium 10/12/2013 139  137 - 147 mEq/L Final  . Potassium 10/12/2013 3.9  3.7 - 5.3 mEq/L Final  . Chloride 10/12/2013 103  96 - 112 mEq/L Final  . CO2 10/12/2013 25  19 - 32 mEq/L Final  . Glucose, Bld 10/12/2013 91  70 - 99 mg/dL Final  . BUN 10/12/2013 13  6 - 23 mg/dL Final  . Creatinine, Ser 10/12/2013  0.58  0.50 - 1.10 mg/dL Final  . Calcium 10/12/2013 8.3* 8.4 - 10.5 mg/dL Final  . GFR calc non Af Amer 10/12/2013 89* >90 mL/min Final  . GFR calc Af Amer 10/12/2013 >90  >90 mL/min Final   Comment: (NOTE)                          The eGFR has been calculated using the CKD EPI equation.                          This calculation has not been validated in all clinical situations.                          eGFR's persistently <90 mL/min signify possible Chronic Kidney                          Disease.  . WBC 10/13/2013 9.0  4.0 - 10.5 K/uL Final  . RBC 10/13/2013 3.52* 3.87 - 5.11 MIL/uL Final  . Hemoglobin 10/13/2013 11.3* 12.0 - 15.0 g/dL Final  . HCT 10/13/2013 34.7* 36.0 - 46.0 % Final  . MCV 10/13/2013 98.6  78.0 - 100.0  fL Final  . MCH 10/13/2013 32.1  26.0 - 34.0 pg Final  . MCHC 10/13/2013 32.6  30.0 - 36.0 g/dL Final  . RDW 10/13/2013 13.2  11.5 - 15.5 % Final  . Platelets 10/13/2013 187  150 - 400 K/uL Final  . Sodium 10/13/2013 142  137 - 147 mEq/L Final  . Potassium 10/13/2013 3.5* 3.7 - 5.3 mEq/L Final  . Chloride 10/13/2013 104  96 - 112 mEq/L Final  . CO2 10/13/2013 27  19 - 32 mEq/L Final  . Glucose, Bld 10/13/2013 98  70 - 99 mg/dL Final  . BUN 10/13/2013 12  6 - 23 mg/dL Final  . Creatinine, Ser 10/13/2013 0.51  0.50 - 1.10 mg/dL Final  . Calcium 10/13/2013 8.8  8.4 - 10.5 mg/dL Final  . GFR calc non Af Amer 10/13/2013 >90  >90 mL/min Final  . GFR calc Af Amer 10/13/2013 >90  >90 mL/min Final   Comment: (NOTE)                          The eGFR has been calculated using the CKD EPI equation.                          This calculation has not been validated in all clinical situations.                          eGFR's persistently <90 mL/min signify possible Chronic Kidney                          Disease.  Hospital Outpatient Visit on 10/03/2013  Component Date Value Ref Range Status  . MRSA, PCR 10/03/2013 NEGATIVE  NEGATIVE Final  . Staphylococcus aureus 10/03/2013  POSITIVE* NEGATIVE Final   Comment:                                 The Xpert SA Assay (FDA                          approved for NASAL specimens                          in patients over 73 years of age),                          is one component of                          a comprehensive surveillance                          program.  Test performance has                          been validated by [facility] for patients greater                          than or equal to 1  year old.                          It is not intended                          to diagnose infection nor to                          guide or monitor treatment.  Marland Kitchen aPTT 10/03/2013 27  24 - 37 seconds Final  . WBC 10/03/2013 5.6  4.0 - 10.5 K/uL Final  . RBC 10/03/2013 3.97  3.87 - 5.11 MIL/uL Final  . Hemoglobin 10/03/2013 12.7  12.0 - 15.0 g/dL Final  . HCT 10/03/2013 38.9  36.0 - 46.0 % Final  . MCV 10/03/2013 98.0  78.0 - 100.0 fL Final  . MCH 10/03/2013 32.0  26.0 - 34.0 pg Final  . MCHC 10/03/2013 32.6  30.0 - 36.0 g/dL Final  . RDW 10/03/2013 13.1  11.5 - 15.5 % Final  . Platelets 10/03/2013 269  150 - 400 K/uL Final  . Sodium 10/03/2013 141  137 - 147 mEq/L Final  . Potassium 10/03/2013 4.8  3.7 - 5.3 mEq/L Final  . Chloride 10/03/2013 103  96 - 112 mEq/L Final  . CO2 10/03/2013 27  19 - 32 mEq/L Final  . Glucose, Bld 10/03/2013 93  70 - 99 mg/dL Final  . BUN 10/03/2013 19  6 - 23 mg/dL Final  . Creatinine, Ser 10/03/2013 0.69  0.50 - 1.10 mg/dL Final  . Calcium 10/03/2013 9.4  8.4 - 10.5 mg/dL Final  . Total Protein 10/03/2013 6.6  6.0 - 8.3 g/dL Final  . Albumin 10/03/2013 3.8  3.5 - 5.2 g/dL Final  . AST 10/03/2013 23  0 - 37 U/L Final  . ALT 10/03/2013 17  0 - 35 U/L Final  . Alkaline Phosphatase 10/03/2013 80  39 - 117 U/L Final  . Total Bilirubin 10/03/2013 <0.2* 0.3 - 1.2 mg/dL Final  . GFR calc non Af Amer 10/03/2013 84* >90 mL/min Final  . GFR calc Af Amer  10/03/2013 >90  >90 mL/min Final   Comment: (NOTE)                          The eGFR has been calculated using the CKD EPI equation.                          This calculation has not been validated in all clinical situations.                          eGFR's persistently <90 mL/min signify possible Chronic Kidney                          Disease.  Marland Kitchen Prothrombin Time 10/03/2013 12.4  11.6 - 15.2 seconds Final  . INR 10/03/2013 0.94  0.00 - 1.49 Final  . Color, Urine 10/03/2013 AMBER* YELLOW Final   BIOCHEMICALS MAY BE AFFECTED BY COLOR  . APPearance 10/03/2013 CLEAR  CLEAR Final  . Specific Gravity, Urine 10/03/2013 1.019  1.005 - 1.030 Final  . pH 10/03/2013 6.0  5.0 - 8.0 Final  . Glucose, UA 10/03/2013 NEGATIVE  NEGATIVE mg/dL Final  . Hgb urine dipstick 10/03/2013  NEGATIVE  NEGATIVE Final  . Bilirubin Urine 10/03/2013 NEGATIVE  NEGATIVE Final  . Ketones, ur 10/03/2013 NEGATIVE  NEGATIVE mg/dL Final  . Protein, ur 58/30/9407 NEGATIVE  NEGATIVE mg/dL Final  . Urobilinogen, UA 10/03/2013 0.2  0.0 - 1.0 mg/dL Final  . Nitrite 68/11/8108 NEGATIVE  NEGATIVE Final  . Leukocytes, UA 10/03/2013 NEGATIVE  NEGATIVE Final   MICROSCOPIC NOT DONE ON URINES WITH NEGATIVE PROTEIN, BLOOD, LEUKOCYTES, NITRITE, OR GLUCOSE <1000 mg/dL.  . ABO/RH(D) 10/03/2013 B POS   Final  . Antibody Screen 10/03/2013 NEG   Final  . Sample Expiration 10/03/2013 10/14/2013   Final  . ABO/RH(D) 10/03/2013 B POS   Final     X-Rays:Dg Hip Complete Right  10/03/2013   CLINICAL DATA:  Right hip pain  EXAM: RIGHT HIP - COMPLETE 2+ VIEW  COMPARISON:  None.  FINDINGS: Degenerative changes of the lumbar spine are noted. A scoliosis concave to the right is seen. There is narrowing of the superior joint space on the right with some osteophytic change. This may be compensatory to the known scoliosis. No acute fracture or dislocation is seen. No soft tissue changes are noted.  IMPRESSION: Degenerative changes in the right hip which  may be compensatory to the patient's known scoliosis.   Electronically Signed   By: Alcide Clever M.D.   On: 10/03/2013 15:03   Dg Pelvis Portable  10/11/2013   CLINICAL DATA:  Post RIGHT hip surgery  EXAM: DG C-ARM 1-60 MIN - NRPT [facility]; PORTABLE PELVIS 1-2 VIEWS  COMPARISON:  Portable exam 1119 hr compared to 10/03/2013  FINDINGS: Osseous demineralization.  Components of RIGHT hip prosthesis identified.  No evidence of fracture or dislocation.  Surgical drain overlies surgical bed.  IMPRESSION: RIGHT hip prosthesis without acute complication.   Electronically Signed   By: Ulyses Southward M.D.   On: 10/11/2013 11:57   Dg C-arm 1-60 Min-no Report  10/11/2013   CLINICAL DATA:  Post RIGHT hip surgery  EXAM: DG C-ARM 1-60 MIN - [facility]PT [facility]; PORTABLE PELVIS 1-2 VIEWS  COMPARISON:  Portable exam 1119 hr compared to 10/03/2013  FINDINGS: Osseous demineralization.  Components of RIGHT hip prosthesis identified.  No evidence of fracture or dislocation.  Surgical drain overlies surgical bed.  IMPRESSION: RIGHT hip prosthesis without acute complication.   Electronically Signed   By: Ulyses Southward M.D.   On: 10/11/2013 11:57    EKG:No orders found for this or any previous visit.   Hospital Course: Patient was admitted to [facility] and taken to the OR and underwent the above state procedure without complications.  Patient tolerated the procedure well and was later transferred to the recovery room and then to the orthopaedic floor for postoperative care.  They were given PO and IV analgesics for pain control following their surgery.  They were given 24 hours of postoperative antibiotics of  Anti-infectives   Start     Dose/Rate Route Frequency Ordered Stop   10/11/13 2200  acyclovir (ZOVIRAX) tablet 800 mg     800 mg Oral 2 times daily 10/11/13 1230     10/11/13 1500  ceFAZolin (ANCEF) IVPB 1 g/50 mL premix     1 g 100 mL/hr over 30 Minutes Intravenous Every 6 hours 10/11/13 1230 10/11/13 2100   10/11/13 0819   ceFAZolin (ANCEF) IVPB 2 g/50 mL premix     2 g 100 mL/hr over 30 Minutes Intravenous On call to O.R. 10/11/13 3159 10/11/13 4585     and started  on DVT prophylaxis in the form of Xarelto.   PT and OT were ordered for total hip protocol.  The patient was allowed to be WBAT with therapy. Discharge planning was consulted to help with postop disposition and equipment needs.  Patient had a tough night on the evening of surgery. Events of the first night were reviewed. She was given 2 oxycodone that night and became disoriented and apparently tried to get up and slipped down onto the floor. She was found sitting on her bottom by the bed as per the staff. Daughter in room at the time. Denied any injuires at the time. Assisted back to bed and oxycodone was discontinued. Encouraged tramadol and norco as a backup. She was seen and examined by Dr. Lequita Halt the next morning on rounds and appeared to be okay and stable.  She started to get up OOB with therapy on day one.  Hemovac drain was pulled without difficulty.  The knee immobilizer was removed and discontinued.  Continued to work with therapy into day two.  Dressing was changed on day two and the incision was healing well.  Patient was seen in rounds on day two and was doing well.  It was felt that as long as the patient continued to improve, they would be ready to transfer the following day, Saturday 10-14-2013.  The patient will evaluated by the weekend coverage staff and will discharge if doing well.  This summary was prepared in anticipation of the the patient's transfer over the weekend.   Diet: Cardiac diet Activity:WBAT Follow-up:in 2 weeks Disposition - Skilled nursing facility Discharged Condition: Pending at time of summary, Transfer on Saturday if doing well on weekend rounds.       Discharge Instructions   Call MD / Call 911    Complete by:  As directed   If you experience chest pain or shortness of breath, CALL 911 and be transported to the  hospital emergency room.  If you develope a fever above 101 F, pus (white drainage) or increased drainage or redness at the wound, or calf pain, call your surgeon's office.     Change dressing    Complete by:  As directed   You may change your dressing dressing daily with sterile 4 x 4 inch gauze dressing and paper tape.  Do not submerge the incision under water.     Constipation Prevention    Complete by:  As directed   Drink plenty of fluids.  Prune juice may be helpful.  You may use a stool softener, such as Colace (over the counter) 100 mg twice a day.  Use MiraLax (over the counter) for constipation as needed.     Diet - low sodium heart healthy    Complete by:  As directed      Discharge instructions    Complete by:  As directed   Pick up stool softner and laxative for home. Do not submerge incision under water. May shower. Continue to use ice for pain and swelling from surgery.  Total Hip Protocol.  Take Xarelto for two and a half more weeks, then discontinue Xarelto. Once the patient has completed the blood thinner regimen, then take a Baby 81 mg Aspirin daily for three more weeks.  When discharged from the skilled rehab facility, please have the facility set up the patient's Home Health Physical Therapy prior to being released.  Also provide the patient with their medications at time of release from the facility to include their pain medication,  the muscle relaxants, and their blood thinner medication.  If the patient is still at the rehab facility at time of follow up appointment, please also assist the patient in arranging follow up appointment in our office and any transportation needs.     Do not sit on low chairs, stoools or toilet seats, as it may be difficult to get up from low surfaces    Complete by:  As directed      Driving restrictions    Complete by:  As directed   No driving until released by the physician.     Increase activity slowly as tolerated    Complete by:   As directed      Lifting restrictions    Complete by:  As directed   No lifting until released by the physician.     Patient may shower    Complete by:  As directed   You may shower without a dressing once there is no drainage.  Do not wash over the wound.  If drainage remains, do not shower until drainage stops.     TED hose    Complete by:  As directed   Use stockings (TED hose) for 3 weeks on both leg(s).  You may remove them at night for sleeping.     Weight bearing as tolerated    Complete by:  As directed             Medication List    STOP taking these medications       cholecalciferol 1000 UNITS tablet  Commonly known as:  VITAMIN D     [employer] EASY FIBER PO     Flax Seed Oil 1000 MG Caps     MAGNESIUM OXIDE PO     Melatonin 5 MG Tabs     OVER THE COUNTER MEDICATION     oxaprozin 600 MG tablet  Commonly known as:  DAYPRO     SALONPAS EX     VITAMIN B-2 PO      TAKE these medications       acyclovir 800 MG tablet  Commonly known as:  ZOVIRAX  Take 800 mg by mouth 2 (two) times daily.     atorvastatin 40 MG tablet  Commonly known as:  LIPITOR  Take 40 mg by mouth daily.     butalbital-acetaminophen-caffeine 50-325-40 MG per tablet  Commonly known as:  FIORICET, ESGIC  Take 1 tablet by mouth daily.     chlorhexidine 0.12 % solution  Commonly known as:  PERIDEX  Use as directed 15 mLs in the mouth or throat 2 (two) times daily.     docusate sodium 100 MG capsule  Commonly known as:  COLACE  Take 300-500 mg by mouth at bedtime. Pt will take 3-5 at night depending how she feels     FETZIMA 40 MG Cp24  Generic drug:  Levomilnacipran HCl ER  Take 40 mg by mouth every morning.     gabapentin 300 MG capsule  Commonly known as:  NEURONTIN  Take 300 mg by mouth at bedtime.     HYDROcodone-acetaminophen 10-325 MG per tablet  Commonly known as:  NORCO  Take 1-2 tablets by mouth every 4 (four) hours as needed for moderate pain or severe pain.      methocarbamol 500 MG tablet  Commonly known as:  ROBAXIN  Take 1 tablet (500 mg total) by mouth every 6 (six) hours as needed for muscle spasms.     metoprolol succinate 100  MG 24 hr tablet  Commonly known as:  TOPROL-XL  Take 100 mg by mouth every morning. Take with or immediately following a meal.     metoprolol succinate 50 MG 24 hr tablet  Commonly known as:  TOPROL-XL  Take 50 mg by mouth every morning. Take with or immediately following a meal.     omeprazole 40 MG capsule  Commonly known as:  PRILOSEC  Take 40 mg by mouth every morning.     Potassium 99 MG Tabs  Take 99 mg by mouth daily.     ranitidine 300 MG tablet  Commonly known as:  ZANTAC  Take 300 mg by mouth 2 (two) times daily.     rivaroxaban 10 MG Tabs tablet  Commonly known as:  XARELTO  - Take 1 tablet (10 mg total) by mouth daily with breakfast. Take Xarelto for two and a half more weeks, then discontinue Xarelto.  - Once the patient has completed the blood thinner regimen, then take a Baby 81 mg Aspirin daily for three more weeks.     sennosides-docusate sodium 8.6-50 MG tablet  Commonly known as:  SENOKOT-S  Take 1 tablet by mouth at bedtime.     traMADol-acetaminophen 37.5-325 MG per tablet  Commonly known as:  ULTRACET  Take 2 tablets by mouth daily as needed (mild to moderate pain).     Travoprost (BAK Free) 0.004 % Soln ophthalmic solution  Commonly known as:  TRAVATAN  Place 1 drop into both eyes at bedtime.     zolpidem 5 MG tablet  Commonly known as:  AMBIEN  Take 5 mg by mouth at bedtime.       Follow-up Information   Follow up with Gearlean Alf, MD. Schedule an appointment as soon as possible for a visit in 2 weeks.   Specialty:  Orthopedic Surgery   Contact information:   [address] [place] [address] [phone]       Signed: Arlee Muslim, PA-C Orthopaedic Surgery 10/13/2013, 1:52 PM

## 2013-10-13 NOTE — Progress Notes (Signed)
Physical Therapy Treatment Patient Details Name: Amber [name] MRN: [id number] DOB: 02/08/1941 Today's Date: 10/13/2013    History of Present Illness      PT Comments      Follow Up Recommendations  SNF     Equipment Recommendations  Rolling walker with 5" wheels    Recommendations for Other Services       Precautions / Restrictions Precautions Precautions: Fall Restrictions Weight Bearing Restrictions: No Other Position/Activity Restrictions: WBAT    Mobility  Bed Mobility               General bed mobility comments: In chair on arrival in room  Transfers Overall transfer level: Needs assistance Equipment used: Rolling walker (2 wheeled) Transfers: Sit to/from Stand Sit to Stand: Min assist         General transfer comment: cues for LE management and use of UEs to self assist  Ambulation/Gait Ambulation/Gait assistance: Min assist Ambulation Distance (Feet): 40 Feet Assistive device: Rolling walker (2 wheeled) Gait Pattern/deviations: Step-to pattern;Decreased step length - right;Decreased step length - left;Shuffle;Narrow base of support     General Gait Details: cues for sequence, posture,, stride length, BOS, and position from [employer]   Stairs            Wheelchair Mobility    Modified Rankin (Stroke Patients Only)       Balance                                    Cognition Arousal/Alertness: Awake/alert Behavior During Therapy: WFL for tasks assessed/performed Overall Cognitive Status: Within Functional Limits for tasks assessed                      Exercises      General Comments        Pertinent Vitals/Pain 6/10; premed, muscle relaxer requested, ice packs provided    Home Living                      Prior Function            PT Goals (current goals can now be found in the care plan section) Acute Rehab PT Goals Patient Stated Goal: Walk without pain PT Goal Formulation: With  patient/family Time For Goal Achievement: 10/16/13 Potential to Achieve Goals: Good Progress towards PT goals: Progressing toward goals    Frequency  7X/week    PT Plan Current plan remains appropriate    Co-evaluation             End of Session Equipment Utilized During Treatment: Gait belt Activity Tolerance: Patient tolerated treatment well Patient left: in chair;with call bell/phone within reach;with family/visitor present     Time: [id number] PT Time Calculation (min): 15 min  Charges:  $Gait Training: 8-22 mins                    G Codes:      BRADSHAW,HUNTER 10/13/2013, 11:52 AM

## 2013-10-14 LAB — CBC
HEMATOCRIT: 34.5 % — AB (ref 36.0–46.0)
Hemoglobin: 11.2 g/dL — ABNORMAL LOW (ref 12.0–15.0)
MCH: 32.1 pg (ref 26.0–34.0)
MCHC: 32.5 g/dL (ref 30.0–36.0)
MCV: 98.9 fL (ref 78.0–100.0)
PLATELETS: 214 10*3/uL (ref 150–400)
RBC: 3.49 MIL/uL — ABNORMAL LOW (ref 3.87–5.11)
RDW: 13.3 % (ref 11.5–15.5)
WBC: 8.5 10*3/uL (ref 4.0–10.5)

## 2013-10-14 LAB — BASIC METABOLIC PANEL
BUN: 13 mg/dL (ref 6–23)
CO2: 24 mEq/L (ref 19–32)
Calcium: 8.6 mg/dL (ref 8.4–10.5)
Chloride: 101 mEq/L (ref 96–112)
Creatinine, Ser: 0.6 mg/dL (ref 0.50–1.10)
GFR, EST NON AFRICAN AMERICAN: 88 mL/min — AB (ref >90)
Glucose, Bld: 111 mg/dL — ABNORMAL HIGH (ref 70–99)
POTASSIUM: 3.9 meq/L (ref 3.7–5.3)
Sodium: 138 mEq/L (ref 137–147)

## 2013-10-14 NOTE — Progress Notes (Signed)
[facility] [place]

## 2013-10-14 NOTE — Progress Notes (Signed)
Per MD, Pt ready for d/c.  Notified RN, Pt, her daughter at bedside and facility.  Sent d/c summary and scripts, as facility couldn't locate these documents.  Confirmed receipt of d/c summary and scripts.  Facility ready to receive Pt.  Pt's daughter to provide transportation.  Providence CrosbyAmanda Simpson, LCSWA Clinical Social Work [phone]

## 2013-10-14 NOTE — Progress Notes (Signed)
Physical Therapy Treatment Patient Details Name: Amber [name] MRN: [id number] DOB: 11/25/1940 Today's Date: 10/14/2013    History of Present Illness      PT Comments    Pt planning d/c to SNF this date.  Reviewed car transfers with pt and dtr  Follow Up Recommendations  SNF     Equipment Recommendations  Rolling walker with 5" wheels    Recommendations for Other Services       Precautions / Restrictions Precautions Precautions: Fall Restrictions Weight Bearing Restrictions: No Other Position/Activity Restrictions: WBAT    Mobility  Bed Mobility Overal bed mobility: Needs Assistance Bed Mobility: Sit to Supine       Sit to supine: Min assist   General bed mobility comments: cues for sequence and use of L  LE to self assist  Transfers Overall transfer level: Needs assistance Equipment used: Rolling walker (2 wheeled) Transfers: Sit to/from Stand Sit to Stand: Min assist         General transfer comment: cues for LE management and use of UEs to self assist  Ambulation/Gait Ambulation/Gait assistance: Min assist;Min guard Ambulation Distance (Feet): 75 Feet (twice) Assistive device: Rolling walker (2 wheeled) Gait Pattern/deviations: Step-to pattern;Decreased step length - right;Decreased step length - left;Shuffle;Narrow base of support     General Gait Details: cues for sequence, posture,, stride length, BOS, and position from [employer]W   Stairs            Wheelchair Mobility    Modified Rankin (Stroke Patients Only)       Balance                                    Cognition Arousal/Alertness: Awake/alert Behavior During Therapy: WFL for tasks assessed/performed Overall Cognitive Status: Within Functional Limits for tasks assessed                      Exercises Total Joint Exercises Ankle Circles/Pumps: AROM;Both;Supine;15 reps Quad Sets: AROM;Both;10 reps;Supine Heel Slides: AAROM;Right;Supine;20 reps Hip  ABduction/ADduction: AAROM;Right;Supine;15 reps    General Comments        Pertinent Vitals/Pain 4/10; premed, ice packs provided    Home Living                      Prior Function            PT Goals (current goals can now be found in the care plan section) Acute Rehab PT Goals Patient Stated Goal: Walk without pain PT Goal Formulation: With patient/family Time For Goal Achievement: 10/16/13 Potential to Achieve Goals: Good Progress towards PT goals: Progressing toward goals    Frequency  7X/week    PT Plan Current plan remains appropriate    Co-evaluation             End of Session Equipment Utilized During Treatment: Gait belt Activity Tolerance: Patient tolerated treatment well Patient left: in bed;with call bell/phone within reach;with family/visitor present     Time: 0925-0951 PT Time Calculation (min): 26 min  Charges:  $Gait Training: 8-22 mins $Therapeutic Exercise: 8-22 mins                    G Codes:      BRADSHAW,HUNTER 10/14/2013, 12:07 PM

## 2013-10-14 NOTE — Progress Notes (Signed)
Amber [name]  MRN: [id number] DOB/Age: 73/08/1940 73 y.o. Physician: Lynnea MaizesK Supple, M.D. 3 Days Post-Op Procedure(s) (LRB): RIGHT TOTAL HIP ARTHROPLASTY ANTERIOR APPROACH (Right)  Subjective: Resting comfortably in bed this am, anticipating D/C to SNF today D/C to SNF Vital Signs Temp:  [98.1 F (36.7 C)-98.9 F (37.2 C)] 98.6 F (37 C) (06/27 0507) Pulse Rate:  [67-93] 93 (06/27 0507) Resp:  [16-18] 18 (06/27 0800) BP: (117-132)/(64-76) 131/76 mmHg (06/27 0507) SpO2:  [92 %-99 %] 92 % (06/27 0507)  Lab Results  Recent Labs  10/13/13 0414 10/14/13 0520  WBC 9.0 8.5  HGB 11.3* 11.2*  HCT 34.7* 34.5*  PLT 187 214   BMET  Recent Labs  10/13/13 0414 10/14/13 0520  NA 142 138  K 3.5* 3.9  CL 104 101  CO2 27 24  GLUCOSE 98 111*  BUN 12 13  CREATININE 0.51 0.60  CALCIUM 8.8 8.6   INR  Date Value Ref Range Status  10/03/2013 0.94  0.00 - 1.49 Final     Exam  R hip incision clean and dry, thigh soft and non tender, N/V intact distally  Plan To SNF today  SUPPLE,KEVIN M 10/14/2013, 8:36 AM

## 2013-10-15 NOTE — Progress Notes (Signed)
Clinical Social Work Department CLINICAL SOCIAL WORK PLACEMENT NOTE 10/15/2013  Patient:  Amber [name],Amber [name]  Account Number:  [id number] Admit date:  10/11/2013  Clinical Social Worker:  Cori RazorJAMIE HAIDINGER, LCSW  Date/time:  10/11/2013 02:52 PM  Clinical Social Work is seeking post-discharge placement for this patient at the following level of care:   SKILLED NURSING   (*CSW will update this form in Epic as items are completed)     Patient/family provided with [facility] of Clinical [facility] Work's list of facilities offering this level of care within the geographic area requested by the patient (or if unable, by the patient's family).  10/11/2013  Patient/family informed of their freedom to choose among providers that offer the needed level of care, that participate in Medicare, Medicaid or managed care program needed by the patient, have an available bed and are willing to accept the patient.    Patient/family informed of [facility]' ownership interest in [facility], as well as of the fact that they are under no obligation to receive care at this facility.  PASARR submitted to EDS on  PASARR number received on   FL2 transmitted to all facilities in geographic area requested by pt/family on  10/11/2013 FL2 transmitted to all facilities within larger geographic area on   Patient informed that his/her managed care company has contracts with or will negotiate with  certain facilities, including the following:     Patient/family informed of bed offers received:  10[facility]11/2013 [facility] Physician recommends and patient chooses bed at    [facility]  10/14/2013 Patient to be transferred to facility by DAUGHTER Patient and family notified of transfer on 10/14/2013 Name of family member notified:  DAUGHTER  The following physician request were entered in Epic:   Additional Comments:  Cori RazorJamie Haidinger LCSW  [phone]

## 2014-10-10 NOTE — Telephone Encounter (Signed)
error 

## 2016-06-18 ENCOUNTER — Encounter: Payer: Self-pay | Admitting: Neurology

## 2016-06-18 ENCOUNTER — Ambulatory Visit (INDEPENDENT_AMBULATORY_CARE_PROVIDER_SITE_OTHER): Payer: Medicare Other | Admitting: Neurology

## 2016-06-18 ENCOUNTER — Other Ambulatory Visit: Payer: Self-pay | Admitting: Neurology

## 2016-06-18 VITALS — BP 118/60 | HR 83 | Ht 61.0 in | Wt 119.0 lb

## 2016-06-18 DIAGNOSIS — E538 Deficiency of other specified B group vitamins: Secondary | ICD-10-CM

## 2016-06-18 DIAGNOSIS — R269 Unspecified abnormalities of gait and mobility: Secondary | ICD-10-CM | POA: Diagnosis not present

## 2016-06-18 DIAGNOSIS — R413 Other amnesia: Secondary | ICD-10-CM | POA: Diagnosis not present

## 2016-06-18 DIAGNOSIS — G20 Parkinson's disease: Secondary | ICD-10-CM

## 2016-06-18 HISTORY — DX: Parkinson's disease: G20

## 2016-06-18 HISTORY — DX: Other amnesia: R41.3

## 2016-06-18 MED ORDER — CARBIDOPA-LEVODOPA 25-100 MG PO TABS: 0.5000 | ORAL_TABLET | Freq: Three times a day (TID) | ORAL | 3 refills | Status: DC

## 2016-06-18 NOTE — Patient Instructions (Signed)
   With the Sinemet 25/100 tablet take 1/2 tablet twice a day for 3 weeks, then take 1/2 tablet three times a day.  Sinemet (carbidopa) may result in confusion or hallucinations, drowsiness, nausea, or dizziness. If any significant side effects are noted, please contact our office. Sinemet may not be well absorbed when taken with high protein meals, if tolerated it is best to take 30-45 minutes before you eat.

## 2016-06-18 NOTE — Progress Notes (Signed)
Reason for visit: Parkinson's disease, memory disorder  Referring physician: Dr. Edilia Bo is a 76 y.o. female  History of present illness:  Ms. Trickey is a 76 year old right-handed white female with a history of a change in her ability to ambulate over the last 2 years. The patient has had slowness of movement, and slight difficulty getting up out of a seated position, she has been noted to have masking of the face, and she was felt to have depression. The patient was treated with antidepressant medications, she was taken off of doxepin in the fall of 2017. The patient has had onset of some troubles with memory over the last one year. At this time, she still operates a motor vehicle, but she drives only short distances. She has had no safety issues, she does not have any problems with getting lost. The patient now requires assistance with keeping up with medications and appointments and difficulty with managing finances. Her daughter has taken over these functions. The patient currently lives in her own home, she has a 2 level house, and the bedroom is upstairs. The patient has not had any recent falls, she did fall one year ago. The patient has had some urinary incontinence, she is followed through urology for this. She has chronic insomnia, she takes Benadryl at night for sleep. The patient reports some numbness in the hands, she denies numbness in the feet. She denies any weakness of the extremities. She has had a lowering of amplitude of her voice, but no difficulty with swallowing. The patient had a change in her handwriting with micrographia. She denies any tremors. Her mother had Parkinson's disease. The patient is sent to this office for further evaluation.  Past Medical History:  Diagnosis Date  . Arthritis   . Depression   . Fibromyalgia   . GERD (gastroesophageal reflux disease)   . Headache(784.0)   . High cholesterol   . Hypertension   . Memory difficulty 06/18/2016    . Parkinson's disease (HCC) 06/18/2016  . Sleep apnea    diagnosed years ago but does not use CPAP    Past Surgical History:  Procedure Laterality Date  . APPENDECTOMY    . CARPAL TUNNEL RELEASE     bilateral  . TONSILLECTOMY     as child  . TOTAL HIP ARTHROPLASTY Right 10/11/2013   Procedure: RIGHT TOTAL HIP ARTHROPLASTY ANTERIOR APPROACH;  Surgeon: Loanne Drilling, MD;  Location: [facility];  Service: Orthopedics;  Laterality: Right;    History reviewed. No pertinent family history.  Social history:  reports that she quit smoking about 37 years ago. Her smoking use included Cigarettes. She has never used smokeless tobacco. She reports that she does not drink alcohol or use drugs.  Medications:  Prior to Admission medications   Medication Sig Start Date End Date Taking? Authorizing Provider  BABY ASPIRIN PO Take 81 mg by mouth daily.   Yes Historical Provider, MD  butalbital-acetaminophen-caffeine (FIORICET, ESGIC) 50-325-40 MG per tablet Take 1 tablet by mouth daily.   Yes Historical Provider, MD  mirabegron ER (MYRBETRIQ) 25 MG TB24 tablet Take 25 mg by mouth daily.   Yes Historical Provider, MD  ranitidine (ZANTAC) 300 MG tablet Take 300 mg by mouth 2 (two) times daily.   Yes Historical Provider, MD  rivaroxaban (XARELTO) 10 MG TABS tablet Take 1 tablet (10 mg total) by mouth daily with breakfast. Take Xarelto for two and a half more weeks, then discontinue Xarelto. Once the  patient has completed the blood thinner regimen, then take a Baby 81 mg Aspirin daily for three more weeks. 10/13/13  Yes Alexzandrew L Perkins, PA-C  UNABLE TO FIND Take 2 capsules by mouth daily. Med Name: Digest supplement   Yes Historical Provider, MD  carbidopa-levodopa (SINEMET IR) 25-100 MG tablet Take 0.5 tablets by mouth 3 (three) times daily. 06/18/16   York Spaniel, MD     No Known Allergies  ROS:  Out of a complete 14 system review of symptoms, the patient complains only of the following symptoms,  and all other reviewed systems are negative.  Fatigue Ringing in the ears Blurred vision, cataracts Constipation Urination problems, urinary incontinence Feeling cold Joint pains Memory loss, confusion, weakness Depression, anxiety, decreased energy, disinterest in activities Insomnia Word finding problems  Blood pressure 118/60, pulse 83, height 5\' 1"  (1.549 m), weight 119 lb (54 kg).  Physical Exam  General: The patient is alert and cooperative at the time of the examination.  Eyes: Pupils are equal, round, and reactive to light. Discs are flat bilaterally.  Neck: The neck is supple, no carotid bruits are noted.  Respiratory: The respiratory examination is clear.  Cardiovascular: The cardiovascular examination reveals a regular rate and rhythm, no obvious murmurs or rubs are noted.  Skin: Extremities are without significant edema.  Neurologic Exam  Mental status: The patient is alert and oriented x 3 at the time of the examination. The Mini-Mental Status Examination done today shows a total score 26/30.  Cranial nerves: Facial symmetry is present. There is good sensation of the face to pinprick and soft touch bilaterally. The strength of the facial muscles and the muscles to head turning and shoulder shrug are normal bilaterally. Speech is well enunciated, no aphasia or dysarthria is noted. Extraocular movements are full. Visual fields are full. The tongue is midline, and the patient has symmetric elevation of the soft palate. No obvious hearing deficits are noted. Masking of the face is seen, the patient has decreased eye blink. There is evidence of lid lag with downward gaze.  Motor: The motor testing reveals 5 over 5 strength of all 4 extremities. Good symmetric motor tone is noted throughout.  Sensory: Sensory testing is intact to pinprick, soft touch, vibration sensation, and position sense on all 4 extremities, with exception of some decreased position sense in the left  hand and both feet. Vibration sensation in the toes is decreased. No evidence of extinction is noted.  Coordination: Cerebellar testing reveals good finger-nose-finger and heel-to-shin bilaterally.  Gait and station: The patient is able to arise from a seated position with arms crossed. Once up, the patient can ambulate independently, there is symmetric arm swing, but the arm swing is decreased. The patient has relatively good turns. Tandem gait is slightly unsteady. Romberg is negative. No drift is seen.  Reflexes: Deep tendon reflexes are symmetric and normal bilaterally. Toes are downgoing bilaterally.   Assessment/Plan:  1. Parkinson's disease  2. Memory disturbance  The patient has developed signs of Parkinson's disease and subsequent issues with dementia. The patient still operates a motor vehicle, this will need to be scrutinized closely in the future. The patient will be placed on low-dose Sinemet working up to a one half tablet 3 time a day regimen of the 25/100 mg tablets. The patient will be set up for blood work today, she will have MRI evaluation of the brain. She will follow-up in 4 months. We will consider adding a medication for memory in  the future.  Marlan Palau. Keith Alicja Everitt MD 06/18/2016 11:05 AM  [facility] [address] [place], [place] [id number]  Phone [phone] Fax [phone]

## 2016-06-19 ENCOUNTER — Telehealth: Payer: Self-pay | Admitting: *Deleted

## 2016-06-19 LAB — VITAMIN B12: Vitamin B-12: 440 pg/mL (ref 232–1245)

## 2016-06-19 LAB — SEDIMENTATION RATE: SED RATE: 2 mm/h (ref 0–40)

## 2016-06-19 LAB — RPR: RPR Ser Ql: NONREACTIVE

## 2016-06-19 NOTE — Telephone Encounter (Signed)
Patients daughter Dewayne Hatchnn called office and was notified that labs were unremarkable per nurse.

## 2016-06-19 NOTE — Telephone Encounter (Signed)
-----   Message from York Spanielharles K Willis, MD sent at 06/19/2016  7:34 AM EST -----   The blood work results are unremarkable. Please call the patient.  ----- Message ----- From: Nell RangeInterface, [facility] Lab Results In Sent: 06/19/2016   5:42 AM To: York Spanielharles K Willis, MD

## 2016-06-19 NOTE — Telephone Encounter (Signed)
Noted, thank you

## 2016-06-19 NOTE — Telephone Encounter (Signed)
Called and LVM for daughter, Dewayne Hatchnn to call about results.   *ok to inform daughter labs unremarkable per CW,MD

## 2016-06-26 ENCOUNTER — Ambulatory Visit (HOSPITAL_COMMUNITY)
Admission: RE | Admit: 2016-06-26 | Discharge: 2016-06-26 | Disposition: A | Discharge status: Home / Self Care | Payer: Medicare Other | Source: Ambulatory Visit | Attending: Neurology | Admitting: Neurology

## 2016-06-26 DIAGNOSIS — R269 Unspecified abnormalities of gait and mobility: Secondary | ICD-10-CM | POA: Diagnosis not present

## 2016-06-26 DIAGNOSIS — J329 Chronic sinusitis, unspecified: Secondary | ICD-10-CM | POA: Diagnosis not present

## 2016-06-26 DIAGNOSIS — G319 Degenerative disease of nervous system, unspecified: Secondary | ICD-10-CM | POA: Diagnosis not present

## 2016-06-26 DIAGNOSIS — R9082 White matter disease, unspecified: Secondary | ICD-10-CM | POA: Insufficient documentation

## 2016-06-28 ENCOUNTER — Telehealth: Payer: Self-pay | Admitting: Neurology

## 2016-06-28 NOTE — Telephone Encounter (Signed)
I called both telephone numbers,both went to voicemail and both mailboxes were full, unable to leave a message. 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   MRI brain 06/26/16:  IMPRESSION: 1. Moderate generalized atrophy and white matter disease without acute intracranial abnormality. This likely reflects the sequela of chronic microvascular ischemia 2. Left maxillary sinus opacification. There appear to be central inspissated secretions. No definite mass lesion is present. There is no bone destruction. This is likely chronic sinus disease.

## 2016-06-28 NOTE — Telephone Encounter (Signed)
I called the patient again, unable to leave a message, no answer.

## 2016-06-29 NOTE — Telephone Encounter (Signed)
I have called patient multiple times, unable to reach her, she will not answer the phone, both telephone numbers listed have answering machines, but the memory is full. Unable to leave a message.

## 2016-07-01 NOTE — Telephone Encounter (Signed)
Pt's daughter-Ann called advised to call her on her cell which has been added and to call her 1st @ [phone].

## 2016-07-01 NOTE — Telephone Encounter (Signed)
I called the daughter, discussed the report of the MRI with her ear the patient has atrophy and small vessel disease to moderate degree. Patient is on low-dose aspirin. The patient likely history of Parkinson's disease, she is getting on low-dose Sinemet at this time, we will gradually go up on the dose.

## 2016-07-08 ENCOUNTER — Telehealth: Payer: Self-pay | Admitting: Neurology

## 2016-07-08 ENCOUNTER — Other Ambulatory Visit: Payer: Self-pay | Admitting: Neurology

## 2016-07-08 MED ORDER — TRAZODONE HCL 50 MG PO TABS: 50.0000 mg | ORAL_TABLET | Freq: Every day | ORAL | 2 refills | Status: DC

## 2016-07-08 NOTE — Telephone Encounter (Signed)
Dr Willis- please advise 

## 2016-07-08 NOTE — Telephone Encounter (Signed)
Patients friend Hildred LaserSue Owen (not listed on DPR but patient was on the phone with Fannie KneeSue and authorized I speak with her) call the office in reference to patient having trouble sleeping.  Patient states she is taking 3 benadryl & 1 10 melatonin with no help.  Patient has a hard time falling asleep at night.  Fannie KneeSue is afraid the mixing of the medications could not be good.  Please call

## 2016-07-08 NOTE — Addendum Note (Signed)
Addended by: York SpanielWILLIS, CHARLES K on: 07/08/2016 05:04 PM   Modules accepted: Orders

## 2016-07-08 NOTE — Telephone Encounter (Signed)
I called patient, she has had a chronic issue with insomnia. She had been on Ambien in the past, she was also on doxepin, but this was discontinued in November 2017 secondary to problems with memory.  The patient has tried Benadryl and melatonin without much benefit.  I will try the patient on low-dose trazodone 50 mg at night, if this is not effective, she will go to 100 mg.

## 2016-09-22 ENCOUNTER — Encounter: Payer: Self-pay | Admitting: Neurology

## 2016-09-29 ENCOUNTER — Other Ambulatory Visit: Payer: Self-pay | Admitting: Neurology

## 2016-09-29 MED ORDER — DONEPEZIL HCL 5 MG PO TABS: 5.0000 mg | ORAL_TABLET | Freq: Every day | ORAL | 1 refills | Status: DC

## 2016-10-09 ENCOUNTER — Other Ambulatory Visit: Payer: Self-pay | Admitting: Neurology

## 2016-10-29 ENCOUNTER — Other Ambulatory Visit: Payer: Self-pay | Admitting: Neurology

## 2016-11-03 ENCOUNTER — Encounter: Payer: Self-pay | Admitting: Neurology

## 2016-11-09 ENCOUNTER — Encounter: Payer: Self-pay | Admitting: Neurology

## 2016-11-09 ENCOUNTER — Other Ambulatory Visit: Payer: Self-pay | Admitting: Neurology

## 2016-11-09 ENCOUNTER — Ambulatory Visit (INDEPENDENT_AMBULATORY_CARE_PROVIDER_SITE_OTHER): Payer: Medicare Other | Admitting: Neurology

## 2016-11-09 VITALS — BP 147/82 | HR 72 | Ht 61.0 in | Wt 111.5 lb

## 2016-11-09 DIAGNOSIS — G20 Parkinson's disease: Secondary | ICD-10-CM | POA: Diagnosis not present

## 2016-11-09 DIAGNOSIS — R413 Other amnesia: Secondary | ICD-10-CM

## 2016-11-09 MED ORDER — MEMANTINE HCL 5 MG PO TABS: ORAL_TABLET | ORAL | 0 refills | Status: DC

## 2016-11-09 NOTE — Patient Instructions (Signed)
   We will start on Namenda for the memory. Call if she tolerates well for ongoing prescription.

## 2016-11-09 NOTE — Progress Notes (Signed)
Reason for visit: Parkinson's disease, dementia  Alean Rinneatricia Klippel is an 76 y.o. female  History of present illness:  Ms. 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 The patient has also developed a memory problem, she was tried on Aricept but could not tolerate the medication. The patient is on Sinemet taking the 25/100 mg tablet, one half tablet 3 times daily. She is tolerating the medication relatively well. The patient is not having any problems with hallucinations. She was on trazodone for sleep, but she found that Benadryl works better and she is doing better with resting at night. She is no longer operating a motor vehicle. The patient has trouble keeping up with appointments, she has required assistance with medications. She has a pill dispenser, but her daughter still needs to call frequently remind her to take her medications. She returns to this office for an evaluation.  Past Medical History:  Diagnosis Date  . Arthritis   . Depression   . Fibromyalgia   . GERD (gastroesophageal reflux disease)   . Headache(784.0)   . High cholesterol   . Hypertension   . Memory difficulty 06/18/2016  . Parkinson's disease (HCC) 06/18/2016  . Sleep apnea    diagnosed years ago but does not use CPAP    Past Surgical History:  Procedure Laterality Date  . APPENDECTOMY    . CARPAL TUNNEL RELEASE     bilateral  . TONSILLECTOMY     as child  . TOTAL HIP ARTHROPLASTY Right 10/11/2013   Procedure: RIGHT TOTAL HIP ARTHROPLASTY ANTERIOR APPROACH;  Surgeon: Loanne DrillingFrank Aluisio V, MD;  Location: [facility];  Service: Orthopedics;  Laterality: Right;    History reviewed. No pertinent family history.  Social history:  reports that she quit smoking about 38 years ago. Her smoking use included Cigarettes. She has never used smokeless tobacco. She reports that she does not drink alcohol or use drugs.    Allergies  Allergen  Reactions  . Aricept [Donepezil Hcl] Itching    Itching, burning in eyes    Medications:  Prior to Admission medications   Medication Sig Start Date End Date Taking? Authorizing Provider  BABY ASPIRIN PO Take 81 mg by mouth daily.   Yes [provider]  butalbital-acetaminophen-caffeine (FIORICET, ESGIC) 50-325-40 MG per tablet Take 1 tablet by mouth daily.   Yes [provider]  carbidopa-levodopa (SINEMET IR) 25-100 MG tablet TAKE 1/2 TABLET BY MOUTH THREE TIMES DAILY 06/19/16  Yes York SpanielWillis, Candence Sease K, MD  DiphenhydrAMINE HCl (BENADRYL PO) Take 2 tablets by mouth at bedtime.   Yes [provider]  ezetimibe (ZETIA) 10 MG tablet Take 1 tablet by mouth daily. 09/03/16  Yes [provider]  mirabegron ER (MYRBETRIQ) 25 MG TB24 tablet Take 25 mg by mouth daily.   Yes [provider]  travoprost, benzalkonium, (TRAVATAN) 0.004 % ophthalmic solution Place 1 drop into both eyes at bedtime.   Yes [provider]  UNABLE TO FIND Take 2 capsules by mouth daily. Med Name: Digest supplement   Yes [provider]    ROS:  Out of a complete 14 system review of symptoms, the patient complains only of the following symptoms, and all other reviewed systems are negative.  Constipation Insomnia, daytime sleepiness Incontinence of the bladder Joint pain, neck pain, neck stiffness Memory loss, headache, numbness, speech difficulty, weakness, facial drooping Confusion, decreased concentration, anxiety  Blood pressure (!) 147/82, pulse 72,  height 5\' 1"  (1.549 m), weight 111 lb 8 oz (50.6 kg).  Physical Exam  General: The patient is alert and cooperative at the time of the examination.  Skin: No significant peripheral edema is noted.   Neurologic Exam  Mental status: The patient is alert and oriented x 2 at the time of the examination (not oriented to date). The Mini-Mental Status Examination done today shows a total score  26/30.   Cranial nerves: Facial symmetry is present. Speech is normal, no aphasia or dysarthria is noted. Extraocular movements are restricted with superior and to some degree with inferior gaze. Visual fields are full. Markedly decreased eye blinking is noted.  Motor: The patient has good strength in all 4 extremities.  Sensory examination: Soft touch sensation is symmetric on the face, arms, and legs.  Coordination: The patient has good finger-nose-finger and heel-to-shin bilaterally.  Gait and station: The patient is able to arise from a seated position with arms crossed. Once up, she can walk independently, decreased arm swing is seen. Tandem gait is minimally unsteady. Romberg is negative. No drift is seen.  Reflexes: Deep tendon reflexes are symmetric.   MRI brain 06/26/16:  IMPRESSION: 1. Moderate generalized atrophy and white matter disease without acute intracranial abnormality. This likely reflects the sequela of chronic microvascular ischemia 2. Left maxillary sinus opacification. There appear to be central inspissated secretions. No definite mass lesion is present. There is no bone destruction. This is likely chronic sinus disease.  * MRI scan images were reviewed online. I agree with the written report.    Assessment/Plan:  1. Parkinson's disease  2. Memory disturbance  The patient is tolerating the Sinemet fairly well, in the future we will go up on the medication. The patient will be placed on Namenda, the family will call me once they get to the full dose, I will call in a maintenance dose prescription. The patient will follow-up in 3 months.   Marlan Palau MD 11/09/2016 11:10 AM  [facility] [address] [place], [place] [address]  Phone [phone] Fax [phone]

## 2016-11-09 NOTE — Progress Notes (Signed)
Faxed printed/signed rx memantine to pt pharmacy. Fax: [phone]. Received confirmation.

## 2016-11-10 ENCOUNTER — Encounter: Payer: Self-pay | Admitting: Neurology

## 2016-11-13 ENCOUNTER — Ambulatory Visit: Payer: Medicare Other | Admitting: Neurology

## 2017-01-06 ENCOUNTER — Encounter: Payer: Self-pay | Admitting: Neurology

## 2017-01-22 ENCOUNTER — Other Ambulatory Visit: Payer: Self-pay | Admitting: Neurology

## 2017-01-22 ENCOUNTER — Encounter: Payer: Self-pay | Admitting: Neurology

## 2017-01-22 MED ORDER — CARBIDOPA-LEVODOPA 25-100 MG PO TABS: 1.0000 | ORAL_TABLET | Freq: Three times a day (TID) | ORAL | 1 refills | Status: DC

## 2017-02-05 ENCOUNTER — Encounter: Payer: Self-pay | Admitting: Neurology

## 2017-02-05 ENCOUNTER — Ambulatory Visit (INDEPENDENT_AMBULATORY_CARE_PROVIDER_SITE_OTHER): Payer: Medicare Other | Admitting: Neurology

## 2017-02-05 VITALS — BP 164/85 | HR 75 | Ht 61.0 in | Wt 108.5 lb

## 2017-02-05 DIAGNOSIS — R413 Other amnesia: Secondary | ICD-10-CM | POA: Diagnosis not present

## 2017-02-05 DIAGNOSIS — G20 Parkinson's disease: Secondary | ICD-10-CM

## 2017-02-05 NOTE — Progress Notes (Signed)
Reason for visit: Parkinson's disease  Amber [name] is an 76 y.o. female  History of present illness:  Amber [name] is a 76 year old right-handed white female with a history of Parkinson's disease. The patient has been recently increased to 25/100 mg Sinemet tablets 3 times daily. This was done within the last 2 weeks. The patient is tolerating this dose fairly well, but she is having some visual hallucinations that are not upsetting to her. She will see people in the house, she realizes that they are not really there. The patient generally sleeps fairly well at night. She has chronic daily headaches, she has been on Fioricet for quite a number of years taking at least 2 tablets daily. The headaches are early in the morning and then come back on around 2 or 3 PM and she has to re-dose. The patient is having difficulty keeping up with her medications, her daughter has to remind her, she does not do any cooking at this point. She is still living in her own home. She is able to go up and down stairs without difficulty. She will occasionally have some freezing episodes, at times she will have difficulty getting up off the toilet. She returns for an evaluation.  Past Medical History:  Diagnosis Date  . Arthritis   . Depression   . Fibromyalgia   . GERD (gastroesophageal reflux disease)   . Headache(784.0)   . High cholesterol   . Hypertension   . Memory difficulty 06/18/2016  . Parkinson's disease (HCC) 06/18/2016  . Sleep apnea    diagnosed years ago but does not use CPAP    Past Surgical History:  Procedure Laterality Date  . APPENDECTOMY    . CARPAL TUNNEL RELEASE     bilateral  . TONSILLECTOMY     as child  . 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 Parkinsonism Mother      Social history:  reports that she quit smoking about 38 years ago. Her smoking use included Cigarettes. She has never used smokeless tobacco. She reports that she does not drink alcohol or use drugs.    Allergies  Allergen Reactions  . Aricept [Donepezil Hcl] Itching    Itching, burning in eyes    Medications:  Prior to Admission medications   Medication Sig Start Date End Date Taking? Authorizing Provider  BABY ASPIRIN PO Take 81 mg by mouth daily.    [provider]  butalbital-acetaminophen-caffeine (FIORICET, ESGIC) 50-325-40 MG per tablet Take 1 tablet by mouth daily.    [provider]  carbidopa-levodopa (SINEMET IR) 25-100 MG tablet Take 1 tablet by mouth 3 (three) times daily. 01/22/17   York Spaniel, MD  DiphenhydrAMINE HCl (BENADRYL PO) Take 2 tablets by mouth at bedtime.    [provider]  ezetimibe (ZETIA) 10 MG tablet Take 1 tablet by mouth daily. 09/03/16   [provider]  memantine (NAMENDA) 5 MG tablet Take 1 tablet daily for one week, then take 1 tablet twice daily for one week, then take 1 tablet in the morning and 2 in the evening for one week, then take 2 tablets twice daily 11/09/16   York Spaniel, MD  mirabegron ER (MYRBETRIQ) 25 MG TB24 tablet Take 25 mg by mouth daily.    [provider]  travoprost, benzalkonium, (TRAVATAN) 0.004 % ophthalmic solution Place 1 drop into both eyes at bedtime.    [provider]  UNABLE TO FIND Take 2 capsules by mouth daily. Med Name: Digest supplement    [provider]    ROS:  Out of a complete 14 system review of symptoms, the patient complains only of the following symptoms, and all other reviewed systems are negative.  Decreased appetite Runny nose Daytime sleepiness Incontinence of the bladder Neck stiffness Memory loss, headache, speech difficulty, weakness, tremors, facial drooping Confusion, hallucinations  Blood pressure 120/80, height  5\' 1"  (1.549 m), weight 108 lb 8 oz (49.2 kg).  Physical Exam  General: The patient is alert and cooperative at the time of the examination.  Skin: No significant peripheral edema is noted.   Neurologic Exam  Mental status: The patient is alert and oriented x 3 at the time of the examination. The patient has apparent normal recent and remote memory, with an apparently normal attention span and concentration ability.   Cranial nerves: Facial symmetry is present. Speech is normal, no aphasia or dysarthria is noted. Extraocular movements are full. Visual fields are full. Masking of the face is seen.  Motor: The patient has good strength in all 4 extremities.  Sensory examination: Soft touch sensation is symmetric on the face, arms, and legs.  Coordination: The patient has good finger-nose-finger and heel-to-shin bilaterally.  Gait and station: The patient has the ability to rise from a seated position with arms crossed. Once up, she walks well without assistance, she has good stride and good turns, no freezing is seen. Tandem gait is normal. Romberg is negative. No drift is seen.  Reflexes: Deep tendon reflexes are symmetric.   Assessment/Plan:  1. Parkinson's disease  2. Memory disorder  3. Hallucinations  4. Chronic daily headaches, possible rebound headaches  The patient has been taking Fioricet on a daily basis for quite a number of years, she may be at risk for rebound type headaches. The patient is having some hallucinations on Sinemet but this is not resulting in any agitation. The patient will continue the Sinemet 25/100 mg tablets, the daughter will call in about 3 or 4 weeks and we may consider continued to increase the dose. The patient may need to come off of the Fioricet to improve her headaches. She may take Aleve or Advil instead. She will follow-up in 4 months. She could not tolerate Namenda or Aricept for memory.  Amber [name]. Keith Sayer Masini MD 02/05/2017 11:28  AM  [facility] [address] [place], [place] [address]  Phone [phone] Fax [phone]

## 2017-04-07 ENCOUNTER — Encounter: Payer: Self-pay | Admitting: Neurology

## 2017-04-07 ENCOUNTER — Telehealth: Payer: Self-pay | Admitting: *Deleted

## 2017-04-07 NOTE — Telephone Encounter (Signed)
Called, LVM for daughter Deway[place]. Advised Dr. Anne HahnWillis received her mychart message. Offered work in visit for this Friday at 12pm, check in 1130am. Asked her to call back to let us know if that appt time works.

## 2017-04-07 NOTE — Telephone Encounter (Signed)
Called daughter back. Scheduled appt for this Friday at 12pm, check in 1130am. She verbalized understanding and agreeable to this appt date/time.

## 2017-04-07 NOTE — Telephone Encounter (Signed)
Pt daughter has called back to say yes she would like to accept the Friday appointment @ 12 if it is still avail.  I looked and told pt daughter I saw 1st avail to be 01-23 @ 8:00 she declined.  Dewayne Hatchnn is asking for a call back.

## 2017-04-09 ENCOUNTER — Ambulatory Visit (INDEPENDENT_AMBULATORY_CARE_PROVIDER_SITE_OTHER): Payer: Medicare Other | Admitting: Neurology

## 2017-04-09 ENCOUNTER — Encounter: Payer: Self-pay | Admitting: Neurology

## 2017-04-09 VITALS — BP 138/80 | HR 97 | Wt 106.5 lb

## 2017-04-09 DIAGNOSIS — G20 Parkinson's disease: Secondary | ICD-10-CM

## 2017-04-09 DIAGNOSIS — R410 Disorientation, unspecified: Secondary | ICD-10-CM | POA: Diagnosis not present

## 2017-04-09 DIAGNOSIS — R413 Other amnesia: Secondary | ICD-10-CM | POA: Diagnosis not present

## 2017-04-09 NOTE — Patient Instructions (Signed)
Reduce the Sinemet to 25/100 mg take 1 and 1/2 tablets three times a day.

## 2017-04-09 NOTE — Progress Notes (Signed)
Reason for visit: Parkinson's disease  Amber [name] is an 76 y.o. female  History of present illness:  Ms. Amber [name] is a 76 year old right-handed white female with a history of Parkinson's disease associated with dementia.  The patient has recently been increased on her Sinemet slowly from 25/100 mg tablets 3 times daily to 50/200 mg dosing 3 times daily.  Within the last 3 weeks she has had a significant dramatic change in cognitive status, she is having increased problems with hallucinations, delusional thinking, she will get up early in the morning and start packing thinking that she is not at home.  The patient is having ongoing problems with urinary incontinence during the day and night, she is followed through urology.  The patient has had a significant improvement in her mobility otherwise, she is walking well with good balance.  She is swallowing without choking.  She is not having significant issues with freezing.  The patient has minimal tremor.  She indicates that she is having a lot of problems with maintaining her train of thought, she cannot get her ideas out well.  The patient returns on an urgent basis for an evaluation.  Past Medical History:  Diagnosis Date  . Arthritis   . Depression   . Fibromyalgia   . GERD (gastroesophageal reflux disease)   . Headache(784.0)   . High cholesterol   . Hypertension   . Memory difficulty 06/18/2016  . Parkinson's disease (HCC) 06/18/2016  . Sleep apnea    diagnosed years ago but does not use CPAP    Past Surgical History:  Procedure Laterality Date  . APPENDECTOMY    . CARPAL TUNNEL RELEASE     bilateral  . TONSILLECTOMY     as child  . 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 Parkinsonism Mother     Social history:  reports  that she quit smoking about 38 years ago. Her smoking use included cigarettes. she has never used smokeless tobacco. She reports that she does not drink alcohol or use drugs.    Allergies  Allergen Reactions  . Aricept [Donepezil Hcl] Itching    Itching, burning in eyes    Medications:  Prior to Admission medications   Medication Sig Start Date End Date Taking? Authorizing Provider  BABY ASPIRIN PO Take 81 mg by mouth daily.    [provider]  butalbital-acetaminophen-caffeine (FIORICET, ESGIC) 50-325-40 MG per tablet Take 1 tablet by mouth daily.    [provider]  carbidopa-levodopa (SINEMET IR) 25-100 MG tablet Take 1 tablet by mouth 3 (three) times daily. 01/22/17   York SpanielWillis, Charles K, MD  DiphenhydrAMINE HCl (BENADRYL PO) Take 2 tablets by mouth at bedtime.    [provider]  ezetimibe (ZETIA) 10 MG tablet Take 1 tablet by mouth daily. 09/03/16   [provider]  mirabegron ER (MYRBETRIQ) 25 MG TB24 tablet Take 25 mg by mouth daily.    [provider]  travoprost, benzalkonium, (TRAVATAN) 0.004 % ophthalmic solution Place 1 drop into both eyes at bedtime.    [provider]  UNABLE TO FIND Take 2 capsules by mouth daily. Med Name: Digest supplement    [provider]    ROS:  Out of a complete 14 system review of symptoms, the patient complains  only of the following symptoms, and all other reviewed systems are negative.  Decreased activity Frequent waking, daytime sleepiness Incontinence of the bladder, frequency of urination Itching Memory loss, headache, speech difficulty, weakness, facial drooping Agitation, confusion, decreased concentration, hallucinations  Blood pressure 138/80, pulse 97, weight 106 lb 8 oz (48.3 kg), SpO2 91 %.  Physical Exam  General: The patient is alert and cooperative at the time of the examination.  Skin: No significant peripheral edema is noted.   Neurologic Exam  Mental  status: The patient is alert and oriented x 2 at the time of the examination (not oriented to date). The Mini-Mental status examination done today shows a total score 20/30.   Cranial nerves: Facial symmetry is present. Speech is normal, no aphasia or dysarthria is noted. Extraocular movements are full. Visual fields are full.  Masking of the face is seen.  The patient is minimally verbal.  Motor: The patient has good strength in all 4 extremities.  Sensory examination: Soft touch sensation is symmetric on the face, arms, and legs.  Coordination: The patient has good finger-nose-finger and heel-to-shin bilaterally.  The patient has some apraxia with use of the extremities.  Gait and station: The patient has the ability to walk quite well, takes good stride, decreased arm swing is seen bilaterally.  The patient has good turns. Tandem gait is unsteady. Romberg is negative. No drift is seen.  Reflexes: Deep tendon reflexes are symmetric.   Assessment/Plan:  1.  Parkinson's disease  2.  Dementia  3.  Recent increase in confusion  The patient has had a benefit with the dose increase of the Sinemet with her mobility.  Unfortunately, her mental status has declined, she is having increasing problems with confusion and hallucinations.  The hallucinations are not threatening to her.  We will cut back on the Sinemet taking the 25/100 mg tablets, 1.5 tablets 3 times daily.  The patient will be sent for blood work today and urinalysis to exclude underlying medical issues that may be causing confusion.  The patient will follow-up for next scheduled visit in February 2019.  Marlan Palau. Keith Willis MD 04/09/2017 12:13 PM  [facility] [address] [place], [place] [address]  Phone [phone] Fax [phone]

## 2017-04-10 LAB — CBC WITH DIFFERENTIAL/PLATELET
BASOS ABS: 0 10*3/uL (ref 0.0–0.2)
Basos: 1 %
EOS (ABSOLUTE): 0.2 10*3/uL (ref 0.0–0.4)
EOS: 2 %
HEMATOCRIT: 45 % (ref 34.0–46.6)
HEMOGLOBIN: 14.7 g/dL (ref 11.1–15.9)
IMMATURE GRANULOCYTES: 0 %
Immature Grans (Abs): 0 10*3/uL (ref 0.0–0.1)
Lymphocytes Absolute: 1.6 10*3/uL (ref 0.7–3.1)
Lymphs: 23 %
MCH: 31.8 pg (ref 26.6–33.0)
MCHC: 32.7 g/dL (ref 31.5–35.7)
MCV: 97 fL (ref 79–97)
MONOCYTES: 6 %
Monocytes Absolute: 0.4 10*3/uL (ref 0.1–0.9)
NEUTROS PCT: 68 %
Neutrophils Absolute: 4.7 10*3/uL (ref 1.4–7.0)
Platelets: 228 10*3/uL (ref 150–379)
RBC: 4.62 x10E6/uL (ref 3.77–5.28)
RDW: 13 % (ref 12.3–15.4)
WBC: 7 10*3/uL (ref 3.4–10.8)

## 2017-04-10 LAB — URINALYSIS, ROUTINE W REFLEX MICROSCOPIC
BILIRUBIN UA: NEGATIVE
Glucose, UA: NEGATIVE
NITRITE UA: NEGATIVE
Protein, UA: NEGATIVE
RBC UA: NEGATIVE
SPEC GRAV UA: 1.025 (ref 1.005–1.030)
UUROB: 0.2 mg/dL (ref 0.2–1.0)
pH, UA: 5 (ref 5.0–7.5)

## 2017-04-10 LAB — COMPREHENSIVE METABOLIC PANEL
ALK PHOS: 104 IU/L (ref 39–117)
ALT: 13 IU/L (ref 0–32)
AST: 22 IU/L (ref 0–40)
Albumin/Globulin Ratio: 2.2 (ref 1.2–2.2)
Albumin: 4.4 g/dL (ref 3.5–4.8)
BUN/Creatinine Ratio: 37 — ABNORMAL HIGH (ref 12–28)
BUN: 28 mg/dL — AB (ref 8–27)
Bilirubin Total: 0.3 mg/dL (ref 0.0–1.2)
CO2: 25 mmol/L (ref 20–29)
CREATININE: 0.76 mg/dL (ref 0.57–1.00)
Calcium: 9.3 mg/dL (ref 8.7–10.3)
Chloride: 101 mmol/L (ref 96–106)
GFR calc Af Amer: 88 mL/min/{1.73_m2} (ref >59)
GFR calc non Af Amer: 76 mL/min/{1.73_m2} (ref >59)
GLUCOSE: 90 mg/dL (ref 65–99)
Globulin, Total: 2 g/dL (ref 1.5–4.5)
Potassium: 4.5 mmol/L (ref 3.5–5.2)
Sodium: 140 mmol/L (ref 134–144)
Total Protein: 6.4 g/dL (ref 6.0–8.5)

## 2017-04-10 LAB — MICROSCOPIC EXAMINATION

## 2017-05-04 ENCOUNTER — Other Ambulatory Visit: Payer: Self-pay | Admitting: Neurology

## 2017-05-04 ENCOUNTER — Encounter: Payer: Self-pay | Admitting: Neurology

## 2017-05-04 MED ORDER — QUETIAPINE FUMARATE 25 MG PO TABS: 25.0000 mg | ORAL_TABLET | Freq: Every day | ORAL | 3 refills | Status: DC

## 2017-06-09 ENCOUNTER — Encounter: Payer: Self-pay | Admitting: Neurology

## 2017-06-09 ENCOUNTER — Ambulatory Visit (INDEPENDENT_AMBULATORY_CARE_PROVIDER_SITE_OTHER): Payer: Medicare Other | Admitting: Neurology

## 2017-06-09 VITALS — BP 166/84 | HR 70 | Wt 108.5 lb

## 2017-06-09 DIAGNOSIS — G20 Parkinson's disease: Secondary | ICD-10-CM

## 2017-06-09 DIAGNOSIS — R413 Other amnesia: Secondary | ICD-10-CM | POA: Diagnosis not present

## 2017-06-09 MED ORDER — CARBIDOPA-LEVODOPA ER 50-200 MG PO TBCR: 1.0000 | EXTENDED_RELEASE_TABLET | Freq: Three times a day (TID) | ORAL | 1 refills | Status: DC

## 2017-06-09 NOTE — Progress Notes (Signed)
Reason for visit: Parkinson's disease  Amber [name] is an 77 y.o. female  History of present illness:  Amber [name] is a 77 year old right-handed white female with a history of Parkinson's disease.  The patient has dementia associated with this as well.  The patient had increased confusion on a higher dose of Sinemet when she was seen in December 2018, the Sinemet dose was lowered to 1.5 of the 25/100 mg IR tablets 3 times daily.  The patient has had worsened mobility on the lower dose but no improvement in her mental status.  She is sleeping better on Seroquel 25 mg at night.  In the past, she could not tolerate Aricept.  The patient remains quite forgetful, she now lives with her daughter.  The daughter is helping to manage her day-to-day affairs.  They do pay an assistant to come in and help out.  The patient returns to this office for an evaluation.  The patient has not had any falls, she denies issues with choking with swallowing.  Past Medical History:  Diagnosis Date  . Arthritis   . Depression   . Fibromyalgia   . GERD (gastroesophageal reflux disease)   . Headache(784.0)   . High cholesterol   . Hypertension   . Memory difficulty 06/18/2016  . Parkinson's disease (HCC) 06/18/2016  . Sleep apnea    diagnosed years ago but does not use CPAP    Past Surgical History:  Procedure Laterality Date  . APPENDECTOMY    . CARPAL TUNNEL RELEASE     bilateral  . TONSILLECTOMY     as child  . 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 Parkinsonism Mother     Social history:  reports that she quit smoking about 38 years ago. Her smoking use included cigarettes. she has never used smokeless tobacco. She reports that she does not drink alcohol or use drugs.    Allergies  Allergen  Reactions  . Aricept [Donepezil Hcl] Itching    Itching, burning in eyes    Medications:  Prior to Admission medications   Medication Sig Start Date End Date Taking? Authorizing Provider  BABY ASPIRIN PO Take 81 mg by mouth daily.   Yes [provider]  butalbital-acetaminophen-caffeine (FIORICET, ESGIC) 50-325-40 MG per tablet Take 1 tablet by mouth daily.   Yes [provider]  ezetimibe (ZETIA) 10 MG tablet Take 1 tablet by mouth daily. 09/03/16  Yes [provider]  QUEtiapine (SEROQUEL) 25 MG tablet Take 1 tablet (25 mg total) by mouth at bedtime. 05/04/17  Yes York Spaniel, MD  travoprost, benzalkonium, (TRAVATAN) 0.004 % ophthalmic solution Place 1 drop into both eyes at bedtime.   Yes [provider]  UNABLE TO FIND Take 2 capsules by mouth daily. Med Name: Digest supplement   Yes [provider]  carbidopa-levodopa (SINEMET CR) 50-200 MG tablet Take 1 tablet by mouth 3 (three) times daily. 06/09/17   York Spaniel, MD    ROS:  Out of a complete 14 system review of symptoms, the patient complains only of the following symptoms, and all other reviewed systems are negative.  Constipation Incontinence of the bladder, frequency of urination Memory loss, speech difficulty, weakness, tremors, facial drooping Agitation, decreased concentration, anxiety  Blood pressure (!) 166/84, pulse  70, weight 108 lb 8 oz (49.2 kg).  Physical Exam  General: The patient is alert and cooperative at the time of the examination.  Skin: No significant peripheral edema is noted.   Neurologic Exam  Mental status: The patient is alert and oriented x 3 at the time of the examination.   Cranial nerves: Facial symmetry is present. Speech is normal, no aphasia or dysarthria is noted. Extraocular movements are full. Visual fields are full.  Masking of the face was seen.  Motor: The patient has good strength in all 4 extremities.  Sensory  examination: Soft touch sensation is symmetric on the face, arms, and legs.  Coordination: The patient has good finger-nose-finger and heel-to-shin bilaterally.  Some apraxia with use of the extremities is noted.  Gait and station: The patient has the ability to arise from a seated position with arms crossed.  Once up, the patient can walk with fairly good stride and good turns.  Decreased arm swing is noted.  Tandem gait was not attempted.  Romberg is negative. No drift is seen.  Reflexes: Deep tendon reflexes are symmetric.   Assessment/Plan:  1.  Parkinson's disease  2.  Dementia  The patient will be increased again on the Sinemet going to the 50/200 mg CR tablets taking 1 tablet 3 times daily.  The patient will remain on Seroquel at night.  She will follow-up in about 5 months.  Amber [name]. Keith Jaishaun Mcnab MD 06/09/2017 11:49 AM  [facility] [address] [place], [place] [address]  Phone [phone] Fax [phone]

## 2017-09-01 ENCOUNTER — Other Ambulatory Visit: Payer: Self-pay | Admitting: Neurology

## 2017-09-01 ENCOUNTER — Encounter: Payer: Self-pay | Admitting: Neurology

## 2017-09-01 ENCOUNTER — Other Ambulatory Visit: Payer: Self-pay | Admitting: *Deleted

## 2017-09-01 MED ORDER — QUETIAPINE FUMARATE 25 MG PO TABS: 25.0000 mg | ORAL_TABLET | Freq: Every day | ORAL | 3 refills | Status: DC

## 2017-10-04 ENCOUNTER — Encounter: Payer: Self-pay | Admitting: Neurology

## 2017-10-26 IMAGING — MR MR HEAD W/O CM
8 of 10 series · 34 of 48 positions shown · non-contrast
Comparison: None.

CLINICAL DATA: New diagnosis of Parkinson's disease.  Unsteady gait

EXAM:
MRI HEAD WITHOUT CONTRAST
TECHNIQUE: Multiplanar, multiecho pulse sequences of the brain and surrounding
structures were obtained without intravenous contrast.

[Series 2: T1 · sagittal · 5.0mm · 0.41mm/px · 1 of 21 slices shown]
[im 1/21]
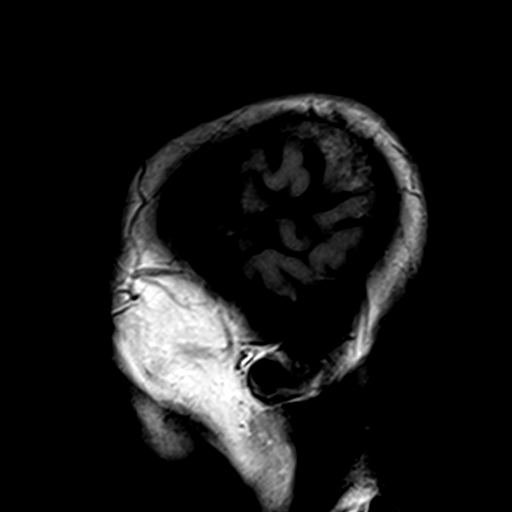

[Series 3: DWI · axial · 3.0mm · 0.74mm/px · z∈[-17,+145]mm · 7 of 55 slices shown (1 of 4)]
[im 1/55]
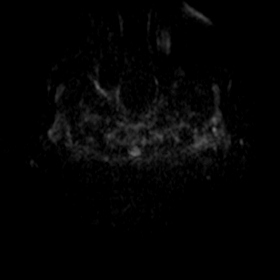
[im 10/55]
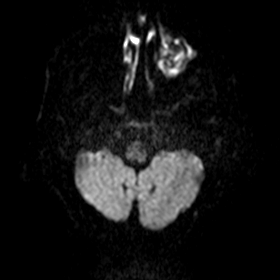
[im 19/55]
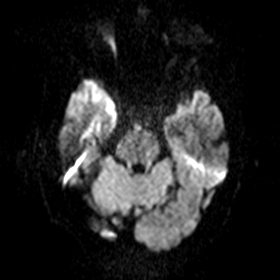
[im 28/55]
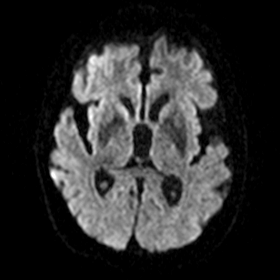
[im 37/55]
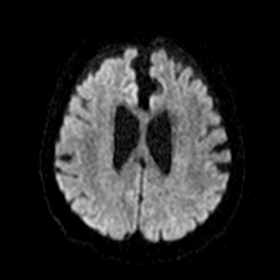
[im 46/55]
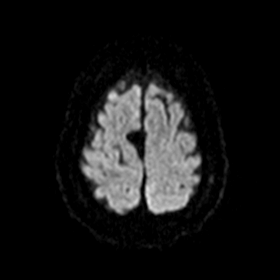
[im 55/55]
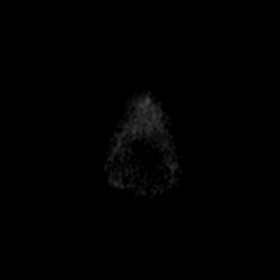

[Series 4: DWI · axial · 3.0mm · 0.76mm/px · z∈[-17,+145]mm · 7 of 55 slices shown (2 of 4)]
[im 1/55]
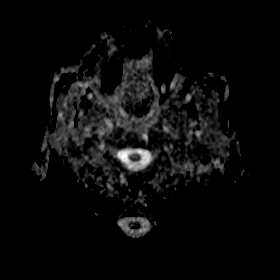
[im 10/55]
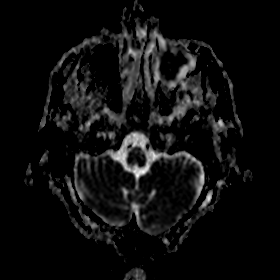
[im 19/55]
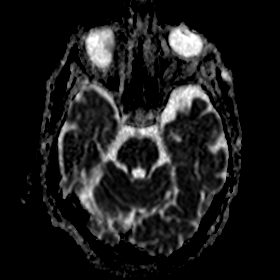
[im 28/55]
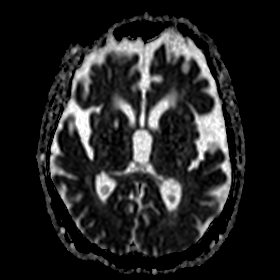
[im 37/55]
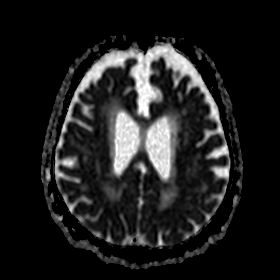
[im 46/55]
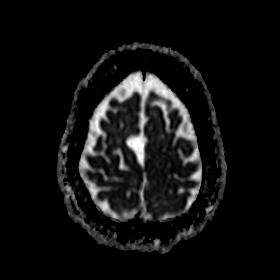
[im 55/55]
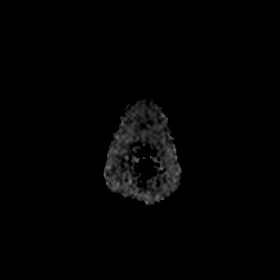

[Series 5: DWI · coronal · 5.0mm · 0.51mm/px · 5 of 38 slices shown (3 of 4)]
[im 1/38]
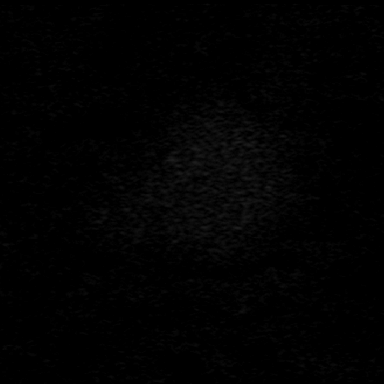
[im 10/38]
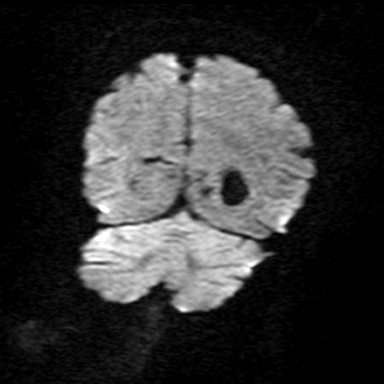
[im 19/38]
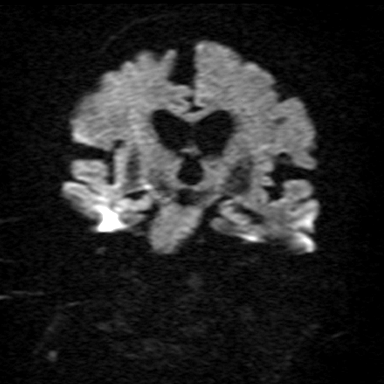
[im 28/38]
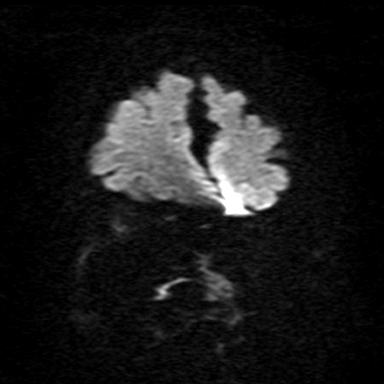
[im 38/38]
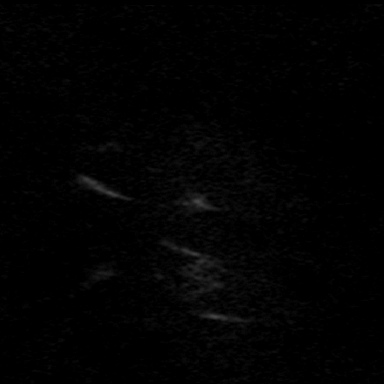

[Series 6: DWI · coronal · 5.0mm · 0.54mm/px · 5 of 38 slices shown (4 of 4)]
[im 1/38]
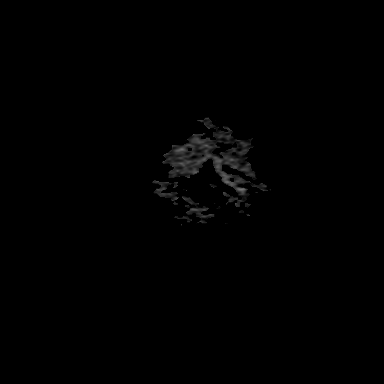
[im 10/38]
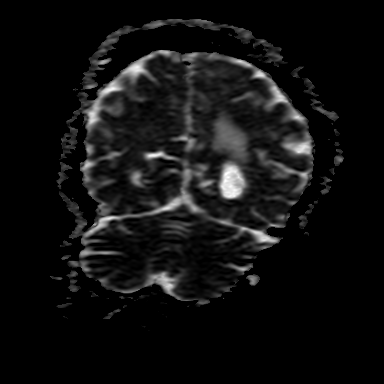
[im 19/38]
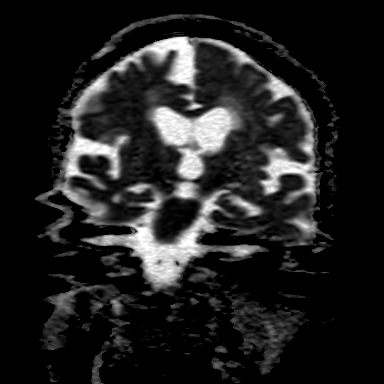
[im 28/38]
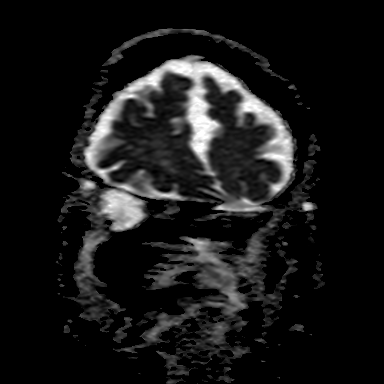
[im 38/38]
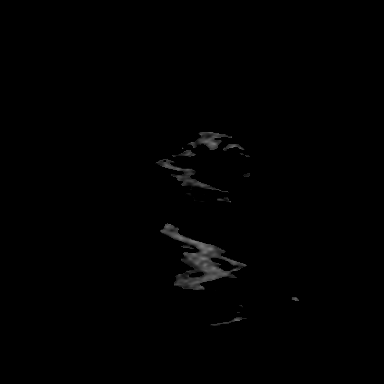

[Series 7: T2 · axial · 5.0mm · 0.46mm/px · z∈[-10,+133]mm · 3 of 23 slices shown (1 of 2)]
[im 1/23]
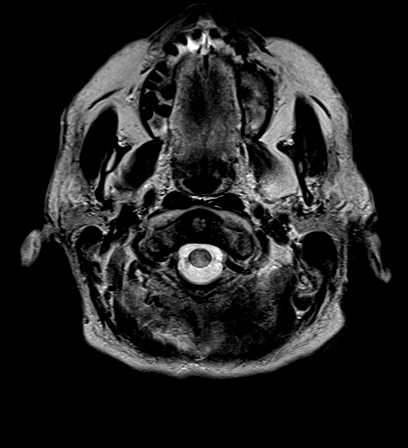
[im 12/23]
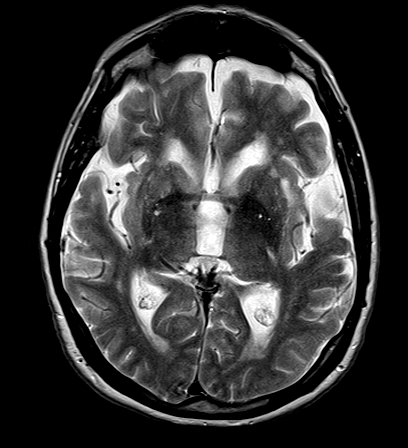
[im 23/23]
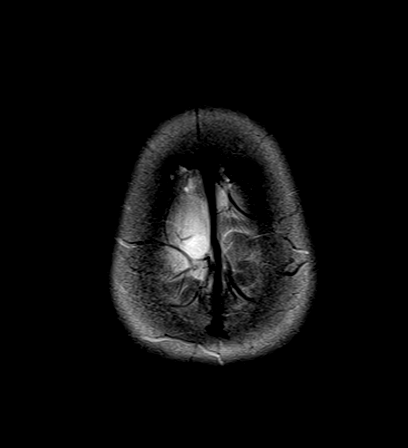

[Series 8: FLAIR · axial · 5.0mm · 0.33mm/px · z∈[-10,+133]mm · 3 of 23 slices shown]
[im 1/23]
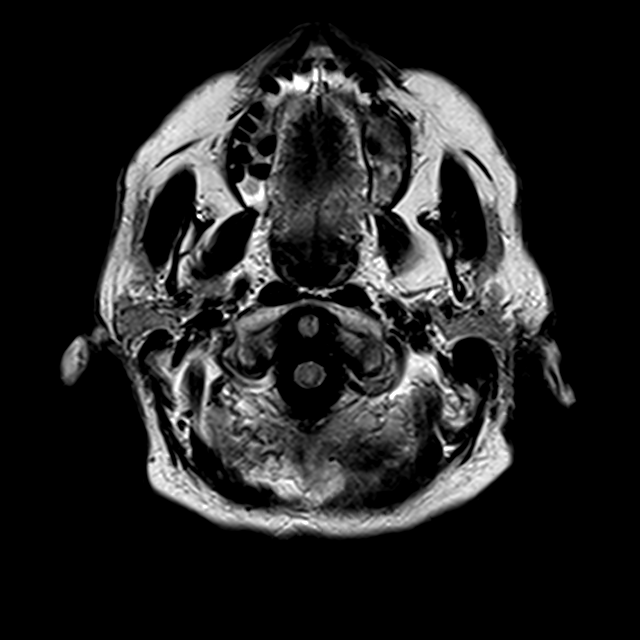
[im 12/23]
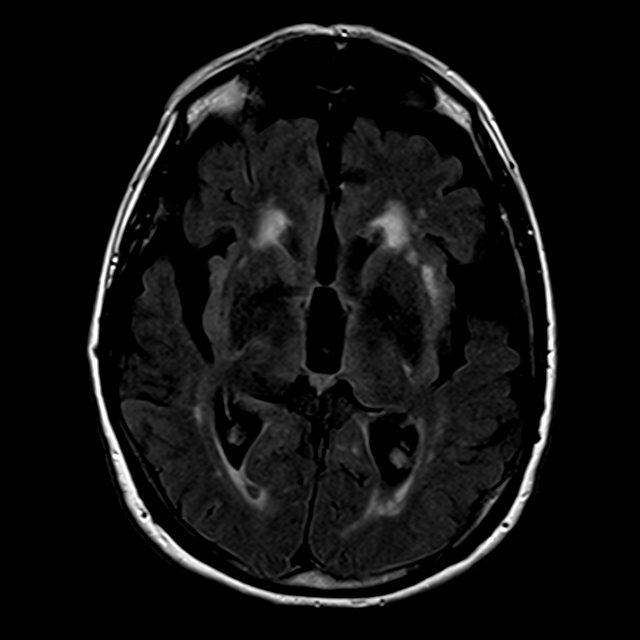
[im 23/23]
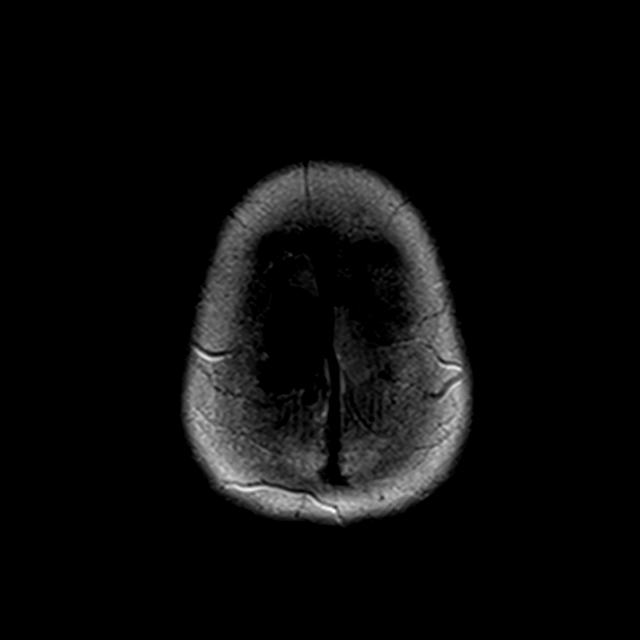

[Series 11: T2 · coronal · 5.0mm · 0.47mm/px · 3 of 28 slices shown (2 of 2)]
[im 1/28]
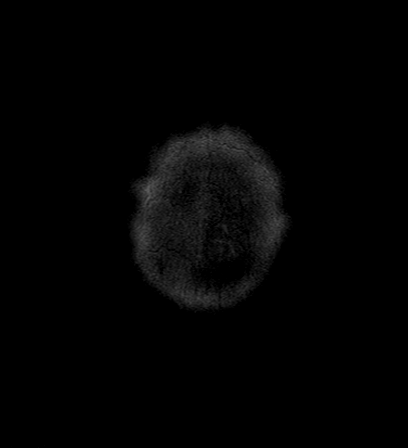
[im 14/28]
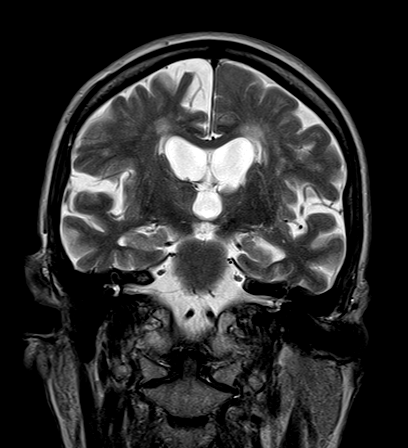
[im 28/28]
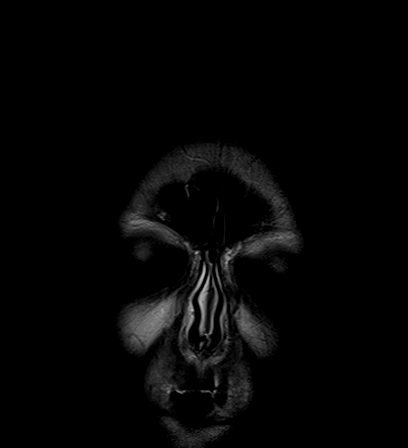

[34 of 48 positions shown; findings below may reference images not displayed]

FINDINGS: Brain: Moderate generalized atrophy and white matter disease is
present bilaterally. Brainstem is unremarkable. The cerebellum is
within normal limits. The internal auditory canals are normal.

Vascular: Flow is present in the major intracranial arteries.

Skull and upper cervical spine: The skullbase is within normal
limits. Marrow signal is normal. Degenerative changes are present at
C3-4. The craniocervical junction is normal. Midline sagittal
structures are unremarkable.

Sinuses/Orbits: The left maxillary sinuses opacified. This appears
chronic. There is no bone destruction. The paranasal sinuses and
mastoid air cells are otherwise clear.
IMPRESSION: 1. Moderate generalized atrophy and white matter disease without
acute intracranial abnormality. This likely reflects the sequela of
chronic microvascular ischemia
2. Left maxillary sinus opacification. There appear to be central
inspissated secretions. No definite mass lesion is present. There is
no bone destruction. This is likely chronic sinus disease.

## 2017-11-09 ENCOUNTER — Encounter: Payer: Self-pay | Admitting: Neurology

## 2017-11-09 ENCOUNTER — Ambulatory Visit (INDEPENDENT_AMBULATORY_CARE_PROVIDER_SITE_OTHER): Payer: Medicare Other | Admitting: Neurology

## 2017-11-09 VITALS — BP 138/78 | HR 77 | Wt 105.5 lb

## 2017-11-09 DIAGNOSIS — R413 Other amnesia: Secondary | ICD-10-CM | POA: Diagnosis not present

## 2017-11-09 DIAGNOSIS — G20 Parkinson's disease: Secondary | ICD-10-CM

## 2017-11-09 MED ORDER — QUETIAPINE FUMARATE 25 MG PO TABS: 25.0000 mg | ORAL_TABLET | Freq: Every day | ORAL | 1 refills | Status: DC

## 2017-11-09 NOTE — Progress Notes (Signed)
Reason for visit: Parkinson's disease  Amber [name] is an 77 y.o. female  History of present illness:  Amber [name] is a 77 year old right-handed white female with a history of Parkinson's disease and dementia.  The patient has done better on the Sinemet CR taking the 50/200 mg tablet 3 times daily.  She does have some occasional episodes of freezing, she has not had any falls.  She is having an ongoing progression of her dementia.  She is having some problems with agitation throughout the day, she sleeps well at night however.  She takes Seroquel at night.  She has had some urinary incontinence off and on, occasional fecal incontinence as well.  The patient lives with her daughter, they do have some assistance coming in twice a week.  In the past, the patient could not tolerate Aricept.  They are not interested currently in starting another medication such as Namenda for the memory.  She is not having hallucinations, she denies issues with swallowing.  She does have some decreased amplitude or hoarseness of the voice.  Past Medical History:  Diagnosis Date  . Arthritis   . Depression   . Fibromyalgia   . GERD (gastroesophageal reflux disease)   . Headache(784.0)   . High cholesterol   . Hypertension   . Memory difficulty 06/18/2016  . Parkinson's disease (HCC) 06/18/2016  . Sleep apnea    diagnosed years ago but does not use CPAP    Past Surgical History:  Procedure Laterality Date  . APPENDECTOMY    . CARPAL TUNNEL RELEASE     bilateral  . TONSILLECTOMY     as child  . 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 Parkinsonism Mother     Social history:  reports that she quit smoking about 39 years ago. Her smoking use included cigarettes. She has never used smokeless tobacco.  She reports that she does not drink alcohol or use drugs.    Allergies  Allergen Reactions  . Aricept [Donepezil Hcl] Itching    Itching, burning in eyes    Medications:  Prior to Admission medications   Medication Sig Start Date End Date Taking? Authorizing Provider  BABY ASPIRIN PO Take 81 mg by mouth daily.   Yes [provider]  carbidopa-levodopa (SINEMET CR) 50-200 MG tablet Take 1 tablet by mouth 3 (three) times daily. 06/09/17  Yes York Spaniel, MD  ezetimibe (ZETIA) 10 MG tablet Take 1 tablet by mouth daily. 09/03/16  Yes [provider]  QUEtiapine (SEROQUEL) 25 MG tablet Take 1 tablet (25 mg total) by mouth at bedtime. 11/09/17  Yes York Spaniel, MD  travoprost, benzalkonium, (TRAVATAN) 0.004 % ophthalmic solution Place 1 drop into both eyes at bedtime.   Yes [provider]  butalbital-acetaminophen-caffeine (FIORICET, ESGIC) 50-325-40 MG per tablet Take 1 tablet by mouth daily.    [provider]    ROS:  Out of a complete 14 system review of symptoms, the patient complains only of the following symptoms, and all other reviewed systems are negative.  Incontinence of the bladder Skin rash Memory loss, speech difficulty, facial drooping Agitation, confusion, anxiety  Blood pressure 138/78, pulse 77, weight 105 lb 8 oz (47.9 kg), SpO2 96 %.  Physical Exam  General: The patient is  alert and cooperative at the time of the examination.  Skin: No significant peripheral edema is noted.   Neurologic Exam  Mental status: The patient is alert and oriented x 2 at the time of the examination (not oriented to date). The Mini-Mental status examination today shows a total score of 19/30..   Cranial nerves: Facial symmetry is present. Speech is hypophonic, not a phasic. Extraocular movements are full. Visual fields are full.  Masking of the face is seen.  Motor: The patient has good strength in all 4 extremities.  Sensory  examination: Soft touch sensation is symmetric on the face, arms, and legs.  Coordination: The patient has good finger-nose-finger and heel-to-shin bilaterally.  Mild apraxia with these extremities is noted.  Gait and station: The patient has the ability to rise from seated position with arms crossed with some difficulty.  Once up, the patient is able to walk independently, she has good stride, she has some slight gait instability with turns.  Decreased arm swing is seen. Tandem gait is surprisingly normal. Romberg is negative. No drift is seen.  Reflexes: Deep tendon reflexes are symmetric.   Assessment/Plan:  1.  Parkinson's disease  2.  Progressive dementia  The patient will remain on the Sinemet at the current dose, she will be given a prescription for the Seroquel.  The patient is having ongoing progression of her memory, the family does not wish to start Namenda at this time.  She will follow-up in 5 months.  Marlan Palau. Keith Willis MD 11/09/2017 12:10 PM  [facility] [address] [place], [place] [id number]  Phone [phone] Fax [phone]

## 2017-12-29 ENCOUNTER — Other Ambulatory Visit: Payer: Self-pay | Admitting: Neurology

## 2017-12-29 NOTE — Telephone Encounter (Signed)
Refill for Sinemet submitted to [facility].

## 2018-04-09 ENCOUNTER — Other Ambulatory Visit: Payer: Self-pay | Admitting: Neurology

## 2018-04-20 DIAGNOSIS — H109 Unspecified conjunctivitis: Secondary | ICD-10-CM

## 2018-04-20 HISTORY — DX: Unspecified conjunctivitis: H10.9

## 2018-05-12 ENCOUNTER — Ambulatory Visit (INDEPENDENT_AMBULATORY_CARE_PROVIDER_SITE_OTHER): Payer: Medicare Other | Admitting: Neurology

## 2018-05-12 ENCOUNTER — Encounter: Payer: Self-pay | Admitting: Neurology

## 2018-05-12 VITALS — BP 126/75 | HR 88 | Wt 102.0 lb

## 2018-05-12 DIAGNOSIS — G20 Parkinson's disease: Secondary | ICD-10-CM | POA: Diagnosis not present

## 2018-05-12 MED ORDER — CARBIDOPA-LEVODOPA ER 50-200 MG PO TBCR: 1.0000 | EXTENDED_RELEASE_TABLET | Freq: Three times a day (TID) | ORAL | 3 refills | Status: DC

## 2018-05-12 MED ORDER — QUETIAPINE FUMARATE 25 MG PO TABS: 25.0000 mg | ORAL_TABLET | Freq: Every day | ORAL | 1 refills | Status: DC

## 2018-05-12 NOTE — Progress Notes (Signed)
Reason for visit: Parkinson's disease, dementia  Amber [name] is an 78 y.o. female  History of present illness:  Amber [name] is a 78 year old right-handed white female with a history of Parkinson's disease associated with a significant dementia.  The patient has had a relatively good mobility, she remains on Sinemet CR taking the 50/200 mg tablets 3 times daily.  The patient has had a fairly significant decline in her cognitive status over the last 6 or 8 months according to her daughter who is taking care of her.  The patient is up and down throughout the day moving things around the house, cutting up vegetables, etc.  The patient however does sleep well at night, she takes Seroquel 25 mg.  She has not had any agitation.  She had a recent fall 2 or 3 days ago landing on her face, she required an emergency room visit and stitches on her lip.  She does not appear to be in significant pain following the fall.  The patient returns to this office for an evaluation.  At times, the patient will chew her Sinemet CR tablets or pocket the tablet in her mouth.  She is losing the ability to know how to use a spoon and a fork to eat.  Past Medical History:  Diagnosis Date  . Arthritis   . Depression   . Fibromyalgia   . GERD (gastroesophageal reflux disease)   . Headache(784.0)   . High cholesterol   . Hypertension   . Memory difficulty 06/18/2016  . Parkinson's disease (HCC) 06/18/2016  . Sleep apnea    diagnosed years ago but does not use CPAP    Past Surgical History:  Procedure Laterality Date  . APPENDECTOMY    . CARPAL TUNNEL RELEASE     bilateral  . TONSILLECTOMY     as child  . 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 Parkinsonism Mother     Social history:  reports that she quit  smoking about 39 years ago. Her smoking use included cigarettes. She has never used smokeless tobacco. She reports that she does not drink alcohol or use drugs.    Allergies  Allergen Reactions  . Aricept [Donepezil Hcl] Itching    Itching, burning in eyes    Medications:  Prior to Admission medications   Medication Sig Start Date End Date Taking? Authorizing Provider  amoxicillin-clavulanate (AUGMENTIN) 875-125 MG tablet TK 1 T PO  Q 12 H FOR 10 DAYS 05/08/18  Yes [provider]  BABY ASPIRIN PO Take 81 mg by mouth daily.   Yes [provider]  bacitracin 500 UNIT/GM ointment Apply 1 application topically 2 (two) times daily.   Yes [provider]  butalbital-acetaminophen-caffeine (FIORICET, ESGIC) 50-325-40 MG per tablet Take 1 tablet by mouth daily.   Yes [provider]  carbidopa-levodopa (SINEMET CR) 50-200 MG tablet TAKE 1 TABLET BY MOUTH THREE TIMES DAILY 04/11/18  Yes York Spaniel, MD  ezetimibe (ZETIA) 10 MG tablet Take 1 tablet by mouth daily. 09/03/16  Yes [provider]  QUEtiapine (SEROQUEL) 25 MG tablet Take 1 tablet (25 mg total) by mouth at bedtime. 11/09/17  Yes York Spaniel, MD  travoprost, benzalkonium, (TRAVATAN) 0.004 % ophthalmic solution Place 1 drop into both eyes at bedtime.  Yes [provider]    ROS:  Out of a complete 14 system review of symptoms, the patient complains only of the following symptoms, and all other reviewed systems are negative.  Cough Incontinence of the bladder Memory loss, speech difficulty Anxiety Skin wounds  Blood pressure 126/75, pulse 88, weight 102 lb (46.3 kg).  Physical Exam  General: The patient is alert and cooperative at the time of the examination.  Skin: No significant peripheral edema is noted.   Neurologic Exam  Mental status: The patient is alert and oriented x 2 at the time of the examination (not oriented to date). The Mini-Mental status  examination done today shows a total score 15/30.   Cranial nerves: Facial symmetry is present. Speech is whispery, hypophonic. Extraocular movements are difficult to test, the patient will not track objects. Visual fields are full to threat.  Motor: The patient has good strength in all 4 extremities.  Sensory examination: Soft touch sensation is symmetric on the face, arms, and legs.  Coordination: The patient has significant apraxia with the use of her arms and legs, she cannot follow commands to perform finger-nose-finger or heel-to-shin.  Gait and station: The patient is able to ambulate independently, posture somewhat stooped, slight shuffling steps.  Decreased arm swing.  Reflexes: Deep tendon reflexes are symmetric.   Assessment/Plan:  1.  Parkinson's disease  2.  Dementia  The patient is progressing with her dementia, her mobility appears to be well preserved, she is still able to ambulate, she has fallen recently but this does not happen frequently.  The patient will continue on her Sinemet CR tablets taking the 50/200 mg tablets 3 times daily.  The patient will follow-up in about 6 months.  A prescription was sent in for the Seroquel and for the Sinemet.  Marlan Palau MD 05/12/2018 3:40 PM  [facility] [address] [place], [place] [address]  Phone [phone] Fax [phone]

## 2018-06-28 ENCOUNTER — Other Ambulatory Visit: Payer: Self-pay | Admitting: Neurology

## 2018-09-28 ENCOUNTER — Other Ambulatory Visit: Payer: Self-pay | Admitting: Neurology

## 2018-11-23 ENCOUNTER — Ambulatory Visit (INDEPENDENT_AMBULATORY_CARE_PROVIDER_SITE_OTHER): Payer: Medicare Other | Admitting: Neurology

## 2018-11-23 ENCOUNTER — Other Ambulatory Visit: Payer: Self-pay

## 2018-11-23 ENCOUNTER — Encounter: Payer: Self-pay | Admitting: Neurology

## 2018-11-23 VITALS — BP 112/66 | HR 99 | Temp 98.9°F | Wt 90.0 lb

## 2018-11-23 DIAGNOSIS — G20 Parkinson's disease: Secondary | ICD-10-CM

## 2018-11-23 DIAGNOSIS — F0280 Dementia in other diseases classified elsewhere without behavioral disturbance: Secondary | ICD-10-CM

## 2018-11-23 HISTORY — DX: Dementia in other diseases classified elsewhere, unspecified severity, without behavioral disturbance, psychotic disturbance, mood disturbance, and anxiety: F02.80

## 2018-11-23 MED ORDER — CARBIDOPA-LEVODOPA 25-100 MG PO TABS: 0.5000 | ORAL_TABLET | Freq: Three times a day (TID) | ORAL | 3 refills | Status: DC

## 2018-11-23 NOTE — Progress Notes (Signed)
Reason for visit: Parkinson's disease, dementia  Amber [name] is an 78 y.o. female  History of present illness:  Amber [name] is a 78 year old right-handed white female with a history of Parkinson's disease associated with dementia.  She has not had any significant falls since last seen.  She has continued to have a decline in cognitive functioning.  She requires assistance with bathing, dressing, and feeding herself.  Her verbal output has dropped off, she has a whispery voice when she does talk.  She sleeps more than she used to, she may sleep 10 to 11 hours a day.  She is eating well but she is losing some weight, they are now giving her boost to supplement her diet.  She has a tendency to lean to one side, she may fall out of a chair on occasion.  She has some variations from day-to-day and her level of functioning.  She remains on Sinemet CR 50/200 mg tablets, she takes 1 tablet 3 times daily.  She is not having hallucinations.  Past Medical History:  Diagnosis Date  . Arthritis   . Conjunctivitis 2020   right   . Depression   . Fibromyalgia   . GERD (gastroesophageal reflux disease)   . Headache(784.0)   . High cholesterol   . Hypertension   . Memory difficulty 06/18/2016  . Parkinson's disease (Onset) 06/18/2016  . Sleep apnea    diagnosed years ago but does not use CPAP    Past Surgical History:  Procedure Laterality Date  . APPENDECTOMY    . CARPAL TUNNEL RELEASE     bilateral  . TONSILLECTOMY     as child  . 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 Parkinsonism Mother     Social history:  reports that she quit smoking about 40 years ago. Her smoking use included cigarettes. She has never used smokeless tobacco. She reports that she does not drink alcohol or use  drugs.    Allergies  Allergen Reactions  . Aricept [Donepezil Hcl] Itching    Itching, burning in eyes    Medications:  Prior to Admission medications   Medication Sig Start Date End Date Taking? Authorizing Provider  BABY ASPIRIN PO Take 81 mg by mouth daily.   Yes [provider]  carbidopa-levodopa (SINEMET CR) 50-200 MG tablet Take 1 tablet by mouth 3 (three) times daily. 05/12/18  Yes Kathrynn Ducking, MD  erythromycin ophthalmic ointment Place into the right eye.  11/22/18  Yes [provider]  ezetimibe (ZETIA) 10 MG tablet Take 1 tablet by mouth daily. 09/03/16  Yes [provider]  QUEtiapine (SEROQUEL) 25 MG tablet TAKE 1 TABLET BY MOUTH AT BEDTIME 09/28/18  Yes Kathrynn Ducking, MD  travoprost, benzalkonium, (TRAVATAN) 0.004 % ophthalmic solution Place 1 drop into both eyes at bedtime.   Yes [provider]  butalbital-acetaminophen-caffeine (FIORICET, ESGIC) 50-325-40 MG per tablet Take 1 tablet by mouth daily.    [provider]    ROS:  Out of a complete 14 system review of symptoms, the patient complains only of the following symptoms, and all other reviewed systems are negative.  Memory disturbance Walking problems Weight loss  Blood pressure 112/66, pulse 99, temperature 98.9 F (37.2 C), weight 90 lb (40.8 kg).  Physical  Exam  General: The patient is alert and cooperative at the time of the examination.  Skin: No significant peripheral edema is noted.   Neurologic Exam  Mental status: The patient is alert, she is oriented to name, not to place or date.   Cranial nerves: Facial symmetry is present. Speech is normal, no aphasia or dysarthria is noted. Extraocular movements are full. Visual fields are full.  Prominent masking the face is seen.  Decreased blink is noted.  Motor: The patient has good strength in all 4 extremities.  Sensory examination: Soft touch examination is difficult.  Coordination: The  patient has significant apraxia, she cannot follow commands for finger-nose-finger or heel-to-shin.  Gait and station: The patient has the ability to walk with assistance or some by herself, she may have a slight tendency to lean backwards.  She is able to stand independently.  Reflexes: Deep tendon reflexes are symmetric.   Assessment/Plan:  1.  Parkinson's disease  2.  Dementia  3.  Gait disorder  The patient continues to progress with her dementia.  The patient will be increased on the Sinemet slightly taking 25/100 mg tablets taking 1/2 tablet with each CR Sinemet.  We will phase in 1/2 tablet every 2 weeks until she takes a half a tablet dosing with each Sinemet CR.  She will follow-up here in 6 months.  Marlan Palau. Keith Sarie Stall MD 11/23/2018 3:41 PM  [facility] [address] [place], [place] [id number]  Phone [phone] Fax [phone]

## 2018-11-23 NOTE — Patient Instructions (Signed)
We will start sinemet 25/100 mg tablets, take 1/2 tablet with the sinemet CR 50/200 mg tablets, phase in the 1/2 tablets every 2 weeks, watch for increasing confusion.

## 2019-01-02 ENCOUNTER — Other Ambulatory Visit: Payer: Self-pay | Admitting: Neurology

## 2019-03-30 ENCOUNTER — Other Ambulatory Visit: Payer: Self-pay | Admitting: Neurology

## 2019-05-01 ENCOUNTER — Encounter: Payer: Self-pay | Admitting: Neurology

## 2019-05-01 ENCOUNTER — Other Ambulatory Visit: Payer: Self-pay | Admitting: *Deleted

## 2019-05-01 MED ORDER — CARBIDOPA-LEVODOPA 25-100 MG PO TABS: 0.5000 | ORAL_TABLET | Freq: Three times a day (TID) | ORAL | 0 refills | Status: DC

## 2019-05-02 ENCOUNTER — Other Ambulatory Visit: Payer: Self-pay | Admitting: *Deleted

## 2019-05-02 MED ORDER — CARBIDOPA-LEVODOPA 25-100 MG PO TABS: 0.5000 | ORAL_TABLET | Freq: Three times a day (TID) | ORAL | 0 refills | Status: DC

## 2019-05-22 ENCOUNTER — Other Ambulatory Visit: Payer: Self-pay

## 2019-05-22 MED ORDER — CARBIDOPA-LEVODOPA 25-100 MG PO TABS: 0.5000 | ORAL_TABLET | Freq: Three times a day (TID) | ORAL | 1 refills | Status: AC

## 2019-06-05 ENCOUNTER — Ambulatory Visit: Payer: Medicare Other | Admitting: Neurology

## 2019-06-05 ENCOUNTER — Other Ambulatory Visit: Payer: Self-pay | Admitting: Neurology

## 2019-07-04 ENCOUNTER — Other Ambulatory Visit: Payer: Self-pay | Admitting: Neurology

## 2019-09-26 ENCOUNTER — Ambulatory Visit: Payer: Medicare Other | Admitting: Neurology

## 2019-10-19 DEATH — deceased

## 2020-01-15 ENCOUNTER — Telehealth: Payer: Self-pay | Admitting: Neurology

## 2020-01-15 NOTE — Telephone Encounter (Signed)
Pt's daughter called to inform us that the pt has passed way. She cancelled pt's appt since she received a reminder call after she had sent a mychart message on the 23rd.

## 2020-01-15 NOTE — Telephone Encounter (Signed)
Events noted

## 2020-01-18 ENCOUNTER — Ambulatory Visit: Payer: Medicare Other | Admitting: Neurology

## 2020-08-26 ENCOUNTER — Other Ambulatory Visit: Payer: Self-pay | Admitting: Neurology
